# Patient Record
Sex: Female | Born: 1975
Health system: Southern US, Community
[De-identification: ages and names within clinical notes are randomized; demographics above are authoritative.]

## PROBLEM LIST (undated history)

## (undated) ENCOUNTER — Inpatient Hospital Stay (HOSPITAL_COMMUNITY): Payer: Self-pay

## (undated) DIAGNOSIS — R918 Other nonspecific abnormal finding of lung field: Secondary | ICD-10-CM

## (undated) DIAGNOSIS — K5900 Constipation, unspecified: Secondary | ICD-10-CM

## (undated) DIAGNOSIS — IMO0001 Reserved for inherently not codable concepts without codable children: Secondary | ICD-10-CM

## (undated) HISTORY — DX: Other nonspecific abnormal finding of lung field: R91.8

---

## 2014-06-30 HISTORY — PX: OVARIAN CYST REMOVAL: SHX89

## 2016-02-07 ENCOUNTER — Ambulatory Visit (HOSPITAL_COMMUNITY)
Admission: EM | Admit: 2016-02-07 | Discharge: 2016-02-07 | Disposition: A | Discharge status: Nursing Home (Includes ICF) | Payer: BLUE CROSS/BLUE SHIELD | Source: Home / Self Care | Attending: Emergency Medicine | Admitting: Emergency Medicine

## 2016-02-07 ENCOUNTER — Emergency Department (HOSPITAL_COMMUNITY): Payer: BLUE CROSS/BLUE SHIELD

## 2016-02-07 ENCOUNTER — Emergency Department (HOSPITAL_COMMUNITY)
Admission: EM | Admit: 2016-02-07 | Discharge: 2016-02-08 | Disposition: A | Discharge status: Home / Self Care | Payer: BLUE CROSS/BLUE SHIELD | Attending: Emergency Medicine | Admitting: Emergency Medicine

## 2016-02-07 ENCOUNTER — Encounter (HOSPITAL_COMMUNITY): Payer: Self-pay

## 2016-02-07 ENCOUNTER — Ambulatory Visit (INDEPENDENT_AMBULATORY_CARE_PROVIDER_SITE_OTHER): Payer: BLUE CROSS/BLUE SHIELD

## 2016-02-07 DIAGNOSIS — R1012 Left upper quadrant pain: Secondary | ICD-10-CM | POA: Insufficient documentation

## 2016-02-07 DIAGNOSIS — R0781 Pleurodynia: Secondary | ICD-10-CM

## 2016-02-07 DIAGNOSIS — R222 Localized swelling, mass and lump, trunk: Secondary | ICD-10-CM | POA: Insufficient documentation

## 2016-02-07 DIAGNOSIS — R918 Other nonspecific abnormal finding of lung field: Secondary | ICD-10-CM

## 2016-02-07 DIAGNOSIS — R070 Pain in throat: Secondary | ICD-10-CM

## 2016-02-07 DIAGNOSIS — Z85118 Personal history of other malignant neoplasm of bronchus and lung: Secondary | ICD-10-CM | POA: Diagnosis not present

## 2016-02-07 DIAGNOSIS — M549 Dorsalgia, unspecified: Secondary | ICD-10-CM | POA: Insufficient documentation

## 2016-02-07 DIAGNOSIS — Z3202 Encounter for pregnancy test, result negative: Secondary | ICD-10-CM | POA: Diagnosis not present

## 2016-02-07 LAB — URINALYSIS, ROUTINE W REFLEX MICROSCOPIC
Bilirubin Urine: NEGATIVE
GLUCOSE, UA: NEGATIVE mg/dL
Ketones, ur: NEGATIVE mg/dL
Leukocytes, UA: NEGATIVE
NITRITE: NEGATIVE
PROTEIN: NEGATIVE mg/dL
Specific Gravity, Urine: 1.011 (ref 1.005–1.030)
pH: 8.5 — ABNORMAL HIGH (ref 5.0–8.0)

## 2016-02-07 LAB — CBC
HCT: 38.5 % (ref 36.0–46.0)
Hemoglobin: 12.9 g/dL (ref 12.0–15.0)
MCH: 32.1 pg (ref 26.0–34.0)
MCHC: 33.5 g/dL (ref 30.0–36.0)
MCV: 95.8 fL (ref 78.0–100.0)
PLATELETS: 231 10*3/uL (ref 150–400)
RBC: 4.02 MIL/uL (ref 3.87–5.11)
RDW: 13.5 % (ref 11.5–15.5)
WBC: 3.5 10*3/uL — AB (ref 4.0–10.5)

## 2016-02-07 LAB — POCT I-STAT, CHEM 8
BUN: <3 mg/dL — ABNORMAL LOW (ref 6–20)
CREATININE: 0.6 mg/dL (ref 0.44–1.00)
Calcium, Ion: 1.01 mmol/L — ABNORMAL LOW (ref 1.12–1.23)
Chloride: 100 mmol/L — ABNORMAL LOW (ref 101–111)
GLUCOSE: 97 mg/dL (ref 65–99)
HCT: 46 % (ref 36.0–46.0)
HEMOGLOBIN: 15.6 g/dL — AB (ref 12.0–15.0)
Potassium: 5.6 mmol/L — ABNORMAL HIGH (ref 3.5–5.1)
Sodium: 138 mmol/L (ref 135–145)
TCO2: 32 mmol/L (ref 0–100)

## 2016-02-07 LAB — POCT URINALYSIS DIP (DEVICE)
Bilirubin Urine: NEGATIVE
Glucose, UA: NEGATIVE mg/dL
KETONES UR: NEGATIVE mg/dL
Leukocytes, UA: NEGATIVE
NITRITE: NEGATIVE
PH: 8.5 — AB (ref 5.0–8.0)
PROTEIN: NEGATIVE mg/dL
Specific Gravity, Urine: 1.01 (ref 1.005–1.030)
Urobilinogen, UA: 0.2 mg/dL (ref 0.0–1.0)

## 2016-02-07 LAB — POC URINE PREG, ED: PREG TEST UR: NEGATIVE

## 2016-02-07 LAB — COMPREHENSIVE METABOLIC PANEL
ALT: 22 U/L (ref 14–54)
AST: 27 U/L (ref 15–41)
Albumin: 4.1 g/dL (ref 3.5–5.0)
Alkaline Phosphatase: 65 U/L (ref 38–126)
Anion gap: 8 (ref 5–15)
BUN: <5 mg/dL — ABNORMAL LOW (ref 6–20)
CHLORIDE: 100 mmol/L — AB (ref 101–111)
CO2: 31 mmol/L (ref 22–32)
CREATININE: 0.66 mg/dL (ref 0.44–1.00)
Calcium: 10 mg/dL (ref 8.9–10.3)
Glucose, Bld: 103 mg/dL — ABNORMAL HIGH (ref 65–99)
Potassium: 4 mmol/L (ref 3.5–5.1)
Sodium: 139 mmol/L (ref 135–145)
Total Bilirubin: 0.4 mg/dL (ref 0.3–1.2)
Total Protein: 7.3 g/dL (ref 6.5–8.1)

## 2016-02-07 LAB — URINE MICROSCOPIC-ADD ON

## 2016-02-07 LAB — LIPASE, BLOOD: LIPASE: 24 U/L (ref 11–51)

## 2016-02-07 MED ORDER — SODIUM CHLORIDE 0.9 % IV BOLUS (SEPSIS)
1000.0000 mL | Freq: Once | INTRAVENOUS | Status: AC
  Administered 2016-02-07: 1000 mL via INTRAVENOUS

## 2016-02-07 MED ORDER — IOPAMIDOL (ISOVUE-300) INJECTION 61%
INTRAVENOUS | Status: AC
  Administered 2016-02-07: 100 mL
  Filled 2016-02-07: qty 100

## 2016-02-07 NOTE — ED Notes (Signed)
Pt.; having rt. Lower abdominal pain for 2 months.  She was diagnosed with lung cancer in [place] and also had an ovarian cyst removed.  She denies any chest pain , n/v /d She denies any vaginal discharge or bleeding. She reports having sob with exertion.  She denies any urinary symptoms, She speaks [place] and her Brother is interrupting for her.

## 2016-02-07 NOTE — ED Provider Notes (Signed)
CSN: [id number]     Arrival date & time 02/07/16  1420 History   First MD Initiated Contact with Patient 02/07/16 1509     Chief Complaint  Patient presents with  . Flank Pain   (Consider location/radiation/quality/duration/timing/severity/associated sxs/prior Treatment) HPI  She is a 40 year old woman here for evaluation of left side pain. Pain has been present for several months. It has not changed. It is present when she stands up and moves. When she lays flat, it goes away.  No nausea, vomiting, diarrhea, constipation. No dysuria or urinary symptoms. She arrived here from [place] 5 months ago. Her brother is with her and acts as interpreter.  She states about 2 years ago she had a somewhat similar pain and was found to have an ovarian cyst that was removed surgically. When she received her visa to come to the [place] in June 2016 she underwent a medical exam and was told she has lung cancer. She has not had any treatment. She has never smoked or used chewing tobacco. She does report weight loss. No shortness of breath.  She has also started a factory job recently.  He also reports an intermittent history of left-sided throat pain. This resolves with a home remedy involving baking soda.  Past Medical History  Diagnosis Date  . Cancer ([place]) 03/2015    Lung   Past Surgical History  Procedure Laterality Date  . Ovarian cyst removal  06/30/2014   No family history on file. Social History  Substance Use Topics  . Smoking status: Never Smoker   . Smokeless tobacco: Never Used  . Alcohol Use: No   OB History    No data available     Review of Systems As in history of present illness Allergies  Review of patient's allergies indicates no known allergies.  Home Medications   Prior to Admission medications   Not on File   Meds Ordered and Administered this Visit  Medications - No data to display  BP 105/66 mmHg  Pulse 74  Temp(Src) 98.5 F (36.9 C) (Oral)  Resp 12  SpO2  100%  LMP 02/05/2016 (Exact Date) No data found.   Physical Exam  Constitutional: She is oriented to person, place, and time. She appears well-developed and well-nourished. No distress.  Thin woman  HENT:  Mouth/Throat: Oropharynx is clear and moist. No oropharyngeal exudate.  Neck: Neck supple.  Cardiovascular: Normal rate, regular rhythm and normal heart sounds.   No murmur heard. Pulmonary/Chest: Effort normal and breath sounds normal. No respiratory distress. She has no wheezes. She has no rales. She exhibits no tenderness.  Abdominal: Soft. Bowel sounds are normal. She exhibits no distension. There is tenderness (very minimal discomfort in left upper quadrant). There is no rebound and no guarding.  Lymphadenopathy:    She has no cervical adenopathy.  Neurological: She is alert and oriented to person, place, and time.    ED Course  Procedures (including critical care time)  Labs Review Labs Reviewed  POCT I-STAT, CHEM 8 - Abnormal; Notable for the following:    Potassium 5.6 (*)    Chloride 100 (*)    BUN <3 (*)    Calcium, Ion 1.01 (*)    Hemoglobin 15.6 (*)    All other components within normal limits  POCT URINALYSIS DIP (DEVICE) - Abnormal; Notable for the following:    Hgb urine dipstick MODERATE (*)    pH 8.5 (*)    All other components within normal limits  Imaging Review Dg Chest 2 View  02/07/2016  CLINICAL DATA:  40 year old female with a history of pain. EXAM: CHEST - 2 VIEW COMPARISON:  None. FINDINGS: Cardiomediastinal silhouette projects within normal limits in size and contour. No confluent airspace disease, pneumothorax, or pleural effusion. Rounded nodule in the right hilar region, with well-defined margin on all sides both anterior lateral view, favored to be within lung No displaced fracture. Unremarkable appearance of the upper abdomen. IMPRESSION: No radiographic evidence of acute cardiopulmonary disease. Right hilar lung nodule. Further evaluation  with contrast-enhanced CT is recommended. These results will be called to the ordering clinician or representative by the Radiologist Assistant, and communication documented in the PACS or zVision Dashboard. Signed, Dulcy Fanny. Earleen Newport, DO [facility] Radiology Electronically Signed   By: Corrie Mckusick D.O.   On: 02/07/2016 16:07     MDM   1. Lung mass   2. Rib pain on left side   3. Throat pain    X-ray shows a perihilar mass. Further evaluation is recommended with CT scan. Given that she does not have a PCP, will transfer to [facility] ED for additional evaluation today. Her rib pain could be musculoskeletal.  With the lung mass I'm concerned that this could be related to potential cancer.  uA with blood, but patient is on her menstrual cycle.    Melony Overly, MD 02/07/16 [phone]

## 2016-02-07 NOTE — ED Notes (Signed)
Patient states she is having pain in her side x2 months, she has been diagnosed with ovarian cyst and she had it removed surgically on 06/30/2014 in [place]. She has currently been diagnosed with lung cancer on June of 2016. Patient has not taken any pain medication, she has no primary physician. No acute distress

## 2016-02-07 NOTE — ED Provider Notes (Addendum)
CSN: [id number]     Arrival date & time 02/07/16  1647 History   First MD Initiated Contact with Patient 02/07/16 2047     Chief Complaint  Patient presents with  . Abdominal Pain     (Consider location/radiation/quality/duration/timing/severity/associated sxs/prior Treatment) HPI Patient presents with several months of left upper abdominal pain that was of gradual onset. Worse with standing and movement. She states at times it radiates to her back. It is improved with lying flat. Denies nausea, vomiting, diarrhea or constipation. No blood in stool. Denies urinary symptoms including dysuria, hematuria, frequency or urgency. She states that when she arrived in the [place] and had a medical exam she was told that she may have cancer. She has not been evaluated since. Denies any fever or chills. States the pain does not change with food. No new lower extremity swelling or pain. Admits to weight loss but is unsure how much. Past Medical History  Diagnosis Date  . Cancer ([place]) 03/2015    Lung   Past Surgical History  Procedure Laterality Date  . Ovarian cyst removal  06/30/2014   No family history on file. Social History  Substance Use Topics  . Smoking status: Never Smoker   . Smokeless tobacco: Never Used  . Alcohol Use: No   OB History    No data available     Review of Systems  Constitutional: Negative for fever and chills.  Respiratory: Negative for cough and shortness of breath.   Cardiovascular: Negative for chest pain, palpitations and leg swelling.  Gastrointestinal: Positive for abdominal pain. Negative for nausea, vomiting, diarrhea, constipation and blood in stool.  Genitourinary: Negative for dysuria, frequency, hematuria, vaginal bleeding, vaginal discharge and pelvic pain.  Musculoskeletal: Positive for back pain. Negative for myalgias, neck pain and neck stiffness.  Skin: Negative for rash and wound.  Neurological: Negative for dizziness, syncope, weakness,  light-headedness, numbness and headaches.  All other systems reviewed and are negative.     Allergies  Review of patient's allergies indicates no known allergies.  Home Medications   Prior to Admission medications   Medication Sig Start Date End Date Taking? Authorizing Provider  dicyclomine (BENTYL) 20 MG tablet Take 1 tablet (20 mg total) by mouth 2 (two) times daily as needed for spasms. 02/07/16   Julianne Rice, MD  docusate sodium (COLACE) 100 MG capsule Take 1 capsule (100 mg total) by mouth every 12 (twelve) hours. 02/07/16   Julianne Rice, MD  polyethylene glycol [facility] / Floria Raveling) packet Take 17 g by mouth daily. 02/07/16   Julianne Rice, MD   BP 123/81 mmHg  Pulse 95  Temp(Src) 98.1 F (36.7 C) (Oral)  Resp 17  SpO2 100%  LMP 02/05/2016 (Exact Date) Physical Exam  Constitutional: She is oriented to person, place, and time. She appears well-developed and well-nourished. No distress.  HENT:  Head: Normocephalic and atraumatic.  Mouth/Throat: Oropharynx is clear and moist. No oropharyngeal exudate.  Eyes: EOM are normal. Pupils are equal, round, and reactive to light.  Neck: Normal range of motion. Neck supple. No JVD present.  Cardiovascular: Normal rate and regular rhythm.  Exam reveals no gallop and no friction rub.   No murmur heard. Pulmonary/Chest: Effort normal and breath sounds normal. No respiratory distress. She has no wheezes. She has no rales. She exhibits no tenderness.  Abdominal: Soft. Bowel sounds are normal. She exhibits no distension and no mass. There is tenderness (left upper quadrant tenderness with palpation. Mild fullness on exam). There is no  rebound and no guarding.  Musculoskeletal: Normal range of motion. She exhibits no edema or tenderness.  No midline thoracic or lumbar tenderness. No bilateral CVA tenderness. No lower extremity swelling, asymmetry or tenderness. Distal pulses are equal and intact.  Neurological: She is alert and oriented  to person, place, and time.  Patient is alert and oriented x3 with clear, goal oriented speech. Patient has 5/5 motor in all extremities. Sensation is intact to light touch.   Skin: Skin is warm and dry. No rash noted. No erythema.  Psychiatric: She has a normal mood and affect. Her behavior is normal.  Nursing note and vitals reviewed.   ED Course  Procedures (including critical care time) Labs Review Labs Reviewed  COMPREHENSIVE METABOLIC PANEL - Abnormal; Notable for the following:    Chloride 100 (*)    Glucose, Bld 103 (*)    BUN <5 (*)    All other components within normal limits  CBC - Abnormal; Notable for the following:    WBC 3.5 (*)    All other components within normal limits  URINALYSIS, ROUTINE W REFLEX MICROSCOPIC (NOT AT [facility]) - Abnormal; Notable for the following:    pH 8.5 (*)    Hgb urine dipstick TRACE (*)    All other components within normal limits  URINE MICROSCOPIC-ADD ON - Abnormal; Notable for the following:    Squamous Epithelial / LPF 0-5 (*)    Bacteria, UA RARE (*)    All other components within normal limits  LIPASE, BLOOD  POC URINE PREG, ED    Imaging Review Dg Chest 2 View  02/07/2016  CLINICAL DATA:  Short of breath. Recent lung cancer diagnosis by patient report. EXAM: CHEST  2 VIEW COMPARISON:  Chest radiograph same day FINDINGS: Again demonstrated round 2.5 cm perihilar lesion on the RIGHT. Lungs are clear otherwise.Mild scoliosis. IMPRESSION: RIGHT perihilar rounded lesion. Recommend CT thorax with contrast for further evaluation. Electronically Signed   By: Suzy Bouchard M.D.   On: 02/07/2016 18:57   Dg Chest 2 View  02/07/2016  CLINICAL DATA:  40 year old female with a history of pain. EXAM: CHEST - 2 VIEW COMPARISON:  None. FINDINGS: Cardiomediastinal silhouette projects within normal limits in size and contour. No confluent airspace disease, pneumothorax, or pleural effusion. Rounded nodule in the right hilar region, with well-defined  margin on all sides both anterior lateral view, favored to be within lung No displaced fracture. Unremarkable appearance of the upper abdomen. IMPRESSION: No radiographic evidence of acute cardiopulmonary disease. Right hilar lung nodule. Further evaluation with contrast-enhanced CT is recommended. These results will be called to the ordering clinician or representative by the Radiologist Assistant, and communication documented in the PACS or zVision Dashboard. Signed, Dulcy Fanny. Earleen Newport, DO [facility] Radiology Electronically Signed   By: Corrie Mckusick D.O.   On: 02/07/2016 16:07   Ct Chest W Contrast  02/07/2016  CLINICAL DATA:  left sided lower chest, upper abdominal pain ongoing for few months. Reports that it get's worse when standing. H/O ovarian cyst removal EXAM: CT CHEST, ABDOMEN, AND PELVIS WITH CONTRAST TECHNIQUE: Multidetector CT imaging of the chest, abdomen and pelvis was performed following the standard protocol during bolus administration of intravenous contrast. CONTRAST:  100 cc ISOVUE-300 IOPAMIDOL (ISOVUE-300) INJECTION 61% COMPARISON:  None. FINDINGS: CT CHEST FINDINGS Mediastinum/Nodes: No axillary supraclavicular adenopathy. No mediastinal hilar adenopathy. No pericardial. Lungs/Pleura: Rounded mass posterior to the RIGHT hilum measures 2.9 x 2.6 cm. This lesion is equal  density to the adjacent vascular structures indicating intense enhancement versus a vascular lesion. Lesion is smoothly marginated. No additional pulmonary lesions. Musculoskeletal: No aggressive osseous lesion. CT ABDOMEN AND PELVIS FINDINGS Hepatobiliary: No focal hepatic lesion. No biliary ductal dilatation. Gallbladder is normal. Common bile duct is normal. Pancreas: Pancreas is normal. No ductal dilatation. No pancreatic inflammation. Spleen: Normal spleen Adrenals/urinary tract: Adrenal glands and kidneys are normal. The ureters and bladder normal. Stomach/Bowel: Stomach,  small bowel, appendix, and cecum are normal. The colon and rectosigmoid colon are normal. Vascular/Lymphatic: Abdominal aorta is normal caliber. There is no retroperitoneal or periportal lymphadenopathy. No pelvic lymphadenopathy. Reproductive: Uterus and ovaries are normal. Other: No free fluid. Musculoskeletal: No aggressive osseous lesion. IMPRESSION: Chest Impression: Uniformly enhancing round mass posterior adjacent to the RIGHT hilum with differential including enhancing mass versus a vascular anomaly. If mass lesion would favor a non aggressive neoplasm. MRI of the thorax without and with contrast may add specificity to differential. Abdomen / Pelvis Impression: No acute findings in the abdomen or pelvis. Electronically Signed   By: Suzy Bouchard M.D.   On: 02/07/2016 23:47   Ct Abdomen Pelvis W Contrast  02/07/2016  CLINICAL DATA:  left sided lower chest, upper abdominal pain ongoing for few months. Reports that it get's worse when standing. H/O ovarian cyst removal EXAM: CT CHEST, ABDOMEN, AND PELVIS WITH CONTRAST TECHNIQUE: Multidetector CT imaging of the chest, abdomen and pelvis was performed following the standard protocol during bolus administration of intravenous contrast. CONTRAST:  100 cc ISOVUE-300 IOPAMIDOL (ISOVUE-300) INJECTION 61% COMPARISON:  None. FINDINGS: CT CHEST FINDINGS Mediastinum/Nodes: No axillary supraclavicular adenopathy. No mediastinal hilar adenopathy. No pericardial. Lungs/Pleura: Rounded mass posterior to the RIGHT hilum measures 2.9 x 2.6 cm. This lesion is equal density to the adjacent vascular structures indicating intense enhancement versus a vascular lesion. Lesion is smoothly marginated. No additional pulmonary lesions. Musculoskeletal: No aggressive osseous lesion. CT ABDOMEN AND PELVIS FINDINGS Hepatobiliary: No focal hepatic lesion. No biliary ductal dilatation. Gallbladder is normal. Common bile duct is normal. Pancreas: Pancreas is normal. No ductal dilatation.  No pancreatic inflammation. Spleen: Normal spleen Adrenals/urinary tract: Adrenal glands and kidneys are normal. The ureters and bladder normal. Stomach/Bowel: Stomach, small bowel, appendix, and cecum are normal. The colon and rectosigmoid colon are normal. Vascular/Lymphatic: Abdominal aorta is normal caliber. There is no retroperitoneal or periportal lymphadenopathy. No pelvic lymphadenopathy. Reproductive: Uterus and ovaries are normal. Other: No free fluid. Musculoskeletal: No aggressive osseous lesion. IMPRESSION: Chest Impression: Uniformly enhancing round mass posterior adjacent to the RIGHT hilum with differential including enhancing mass versus a vascular anomaly. If mass lesion would favor a non aggressive neoplasm. MRI of the thorax without and with contrast may add specificity to differential. Abdomen / Pelvis Impression: No acute findings in the abdomen or pelvis. Electronically Signed   By: Suzy Bouchard M.D.   On: 02/07/2016 23:47   I have personally reviewed and evaluated these images and lab results as part of my medical decision-making.   EKG Interpretation   Date/Time:  Friday February 07 2016 21:17:18 EDT Ventricular Rate:  68 PR Interval:  163 QRS Duration: 65 QT Interval:  379 QTC Calculation: 403 R Axis:   80 Text Interpretation:  Sinus rhythm Nonspecific T abnrm, anterolateral  leads Confirmed by Lita Mains  MD, Isaiyah Feldhaus ([id number]) on 02/08/2016 12:16:36 AM      MDM   Final diagnoses:  Left upper quadrant pain  Chest mass   Explained results of testing and the need to follow-up  for further characterization of chest mass. Patient's CT abdomen read as no acute findings. Patient does have a moderate to large amount of stool in the colon and this would correspond with her episodic abdominal pain radiating to the flank. Will start on bowel regimen. She understands the need to return immediately for worsening symptoms, fever, persistent vomiting or for any concerns.  Repeat  abdominal exam is benign. No rebound or guarding. She is resting comfortably. Vital signs remained stable.   Julianne Rice, MD 02/08/16 [id number]  Julianne Rice, MD 02/08/16 [phone]

## 2016-02-08 MED ORDER — DOCUSATE SODIUM 100 MG PO CAPS: 100.0000 mg | ORAL_CAPSULE | Freq: Two times a day (BID) | ORAL | Status: DC

## 2016-02-08 MED ORDER — POLYETHYLENE GLYCOL 3350 17 G PO PACK
17.0000 g | PACK | Freq: Every day | ORAL | Status: DC
Start: 2016-02-07 — End: 2016-03-10

## 2016-02-08 MED ORDER — DICYCLOMINE HCL 20 MG PO TABS: 20.0000 mg | ORAL_TABLET | Freq: Two times a day (BID) | ORAL | Status: DC | PRN

## 2016-02-08 NOTE — Discharge Instructions (Signed)
You need to establish care with a primary physician and follow-up for further characterization of chest mass. Take medications as prescribed.   Abdominal Pain, Adult Many things can cause abdominal pain. Usually, abdominal pain is not caused by a disease and will improve without treatment. It can often be observed and treated at home. Your health care provider will do a physical exam and possibly order blood tests and X-rays to help determine the seriousness of your pain. However, in many cases, more time must pass before a clear cause of the pain can be found. Before that point, your health care provider may not know if you need more testing or further treatment. HOME CARE INSTRUCTIONS Monitor your abdominal pain for any changes. The following actions may help to alleviate any discomfort you are experiencing:  Only take over-the-counter or prescription medicines as directed by your health care provider.  Do not take laxatives unless directed to do so by your health care provider.  Try a clear liquid diet (broth, tea, or water) as directed by your health care provider. Slowly move to a bland diet as tolerated. SEEK MEDICAL CARE IF:  You have unexplained abdominal pain.  You have abdominal pain associated with nausea or diarrhea.  You have pain when you urinate or have a bowel movement.  You experience abdominal pain that wakes you in the night.  You have abdominal pain that is worsened or improved by eating food.  You have abdominal pain that is worsened with eating fatty foods.  You have a fever. SEEK IMMEDIATE MEDICAL CARE IF:  Your pain does not go away within 2 hours.  You keep throwing up (vomiting).  Your pain is felt only in portions of the abdomen, such as the right side or the left lower portion of the abdomen.  You pass bloody or black tarry stools. MAKE SURE YOU:  Understand these instructions.  Will watch your condition.  Will get help right away if you are not  doing well or get worse.   This information is not intended to replace advice given to you by your health care provider. Make sure you discuss any questions you have with your health care provider.   Document Released: 07/15/2005 Document Revised: 06/26/2015 Document Reviewed: 06/14/2013 Elsevier Interactive Patient Education 2016 [employer].   Constipation, Adult Constipation is when a person has fewer than three bowel movements a week, has difficulty having a bowel movement, or has stools that are dry, hard, or larger than normal. As people grow older, constipation is more common. A low-fiber diet, not taking in enough fluids, and taking certain medicines may make constipation worse.  CAUSES   Certain medicines, such as antidepressants, pain medicine, iron supplements, antacids, and water pills.   Certain diseases, such as diabetes, irritable bowel syndrome (IBS), thyroid disease, or depression.   Not drinking enough water.   Not eating enough fiber-rich foods.   Stress or travel.   Lack of physical activity or exercise.   Ignoring the urge to have a bowel movement.   Using laxatives too much.  SIGNS AND SYMPTOMS   Having fewer than three bowel movements a week.   Straining to have a bowel movement.   Having stools that are hard, dry, or larger than normal.   Feeling full or bloated.   Pain in the lower abdomen.   Not feeling relief after having a bowel movement.  DIAGNOSIS  Your health care provider will take a medical history and perform a physical exam.  Further testing may be done for severe constipation. Some tests may include:  A barium enema X-ray to examine your rectum, colon, and, sometimes, your small intestine.   A sigmoidoscopy to examine your lower colon.   A colonoscopy to examine your entire colon. TREATMENT  Treatment will depend on the severity of your constipation and what is causing it. Some dietary treatments include drinking  more fluids and eating more fiber-rich foods. Lifestyle treatments may include regular exercise. If these diet and lifestyle recommendations do not help, your health care provider may recommend taking over-the-counter laxative medicines to help you have bowel movements. Prescription medicines may be prescribed if over-the-counter medicines do not work.  HOME CARE INSTRUCTIONS   Eat foods that have a lot of fiber, such as fruits, vegetables, whole grains, and beans.  Limit foods high in fat and processed sugars, such as french fries, hamburgers, cookies, candies, and soda.   A fiber supplement may be added to your diet if you cannot get enough fiber from foods.   Drink enough fluids to keep your urine clear or pale yellow.   Exercise regularly or as directed by your health care provider.   Go to the restroom when you have the urge to go. Do not hold it.   Only take over-the-counter or prescription medicines as directed by your health care provider. Do not take other medicines for constipation without talking to your health care provider first.  [place] IF:   You have bright red blood in your stool.   Your constipation lasts for more than 4 days or gets worse.   You have abdominal or rectal pain.   You have thin, pencil-like stools.   You have unexplained weight loss. MAKE SURE YOU:   Understand these instructions.  Will watch your condition.  Will get help right away if you are not doing well or get worse.   This information is not intended to replace advice given to you by your health care provider. Make sure you discuss any questions you have with your health care provider.   Document Released: 07/03/2004 Document Revised: 10/26/2014 Document Reviewed: 07/17/2013 Elsevier Interactive Patient Education [employer].

## 2016-02-18 ENCOUNTER — Ambulatory Visit: Payer: BLUE CROSS/BLUE SHIELD | Attending: Internal Medicine | Admitting: Internal Medicine

## 2016-02-18 ENCOUNTER — Encounter: Payer: Self-pay | Admitting: Internal Medicine

## 2016-02-18 VITALS — BP 109/73 | HR 87 | Temp 98.0°F | Wt 109.6 lb

## 2016-02-18 DIAGNOSIS — K5900 Constipation, unspecified: Secondary | ICD-10-CM | POA: Insufficient documentation

## 2016-02-18 DIAGNOSIS — Z79899 Other long term (current) drug therapy: Secondary | ICD-10-CM | POA: Insufficient documentation

## 2016-02-18 DIAGNOSIS — K219 Gastro-esophageal reflux disease without esophagitis: Secondary | ICD-10-CM | POA: Diagnosis not present

## 2016-02-18 DIAGNOSIS — R918 Other nonspecific abnormal finding of lung field: Secondary | ICD-10-CM | POA: Diagnosis present

## 2016-02-18 DIAGNOSIS — R221 Localized swelling, mass and lump, neck: Secondary | ICD-10-CM | POA: Diagnosis not present

## 2016-02-18 DIAGNOSIS — R634 Abnormal weight loss: Secondary | ICD-10-CM | POA: Diagnosis not present

## 2016-02-18 MED ORDER — PANTOPRAZOLE SODIUM 20 MG PO TBEC: 20.0000 mg | DELAYED_RELEASE_TABLET | Freq: Every day | ORAL | Status: DC

## 2016-02-18 NOTE — Progress Notes (Signed)
Cathy Little, is a 40 y.o. female  CWC:[id number]  VOH:[id number]  DOB - March 31, 1976  CC: No chief complaint on file. er f/u     HPI: Cathy Little is a 40 y.o. female here today to establish medical care.  Pt is new to our clinic.  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  Pt found to be constipated so prescribed bowel regimen. She was also found to have 2.9cm x 2.6cm right hilum mass on CT, felt either enhancing mass vs a vascular anomaly.  MRI recd for further visualizaiton.   Of note, since dx with "cancer" pt has been watching her food intake, limiting salt/sugars.  Per brother who is here w/ her, she became a vegetarian by choice and has lost quite a bit of weight (they could not quantify how much).   Abd pains have improved since using bowel regimen rx and bowel movements more regular.   She complains of raw feeling in her throat, "burnng sensation" all the time, not related w/ food.   Patient has No headache, No chest pain, No abdominal pain - No Nausea, No new weakness tingling or numbness, No Cough - SOB.  [place] interpreter via [place] used throughout exam today.  No Known Allergies Past Medical History  Diagnosis Date  . Cancer ([place]) 03/2015    Lung   Current Outpatient Prescriptions on File Prior to Visit  Medication Sig Dispense Refill  . dicyclomine (BENTYL) 20 MG tablet Take 1 tablet (20 mg total) by mouth 2 (two) times daily as needed for spasms. 20 tablet 0  . docusate sodium (COLACE) 100 MG capsule Take 1 capsule (100 mg total) by mouth every 12 (twelve) hours. 60 capsule 0  . polyethylene glycol (MIRALAX / GLYCOLAX) packet Take 17 g by mouth daily. 14 each 0   No current facility-administered medications on file prior to visit.   No family history on file. Social History   Social History  . Marital Status: Single    Spouse Name: N/A  . Number of Children: N/A  . Years of Education: N/A    Occupational History  . Not on file.   Social History Main Topics  . Smoking status: Never Smoker   . Smokeless tobacco: Never Used  . Alcohol Use: No  . Drug Use: No  . Sexual Activity: Not on file   Other Topics Concern  . Not on file   Social History Narrative    Review of Systems: Constitutional: Negative for fever, chills, diaphoresis, activity change, appetite change and fatigue. 'weight loss" HENT: Negative for ear pain, nosebleeds, congestion, facial swelling, rhinorrhea, neck pain, neck stiffness and ear discharge.  Eyes: Negative for pain, discharge, redness, itching and visual disturbance. Respiratory: Negative for cough, choking, chest tightness, shortness of breath, wheezing and stridor.  Cardiovascular: Negative for chest pain, palpitations and leg swelling. Gastrointestinal: Negative for abdominal distention. Genitourinary: Negative for dysuria, urgency, frequency, hematuria, flank pain, decreased urine volume, difficulty urinating and dyspareunia.  Musculoskeletal: Negative for back pain, joint swelling, arthralgia and gait problem. Neurological: Negative for dizziness, tremors, seizures, syncope, facial asymmetry, speech difficulty, weakness, light-headedness, numbness and headaches.  Hematological: Negative for adenopathy. Does not bruise/bleed easily. Psychiatric/Behavioral: Negative for hallucinations, behavioral problems, confusion, dysphoric mood, decreased concentration and agitation.    Objective:  There were no vitals filed for this visit.  Physical Exam: Constitutional: Patient appears well-developed and well-nourished. No distress. AAOx3,  very thin appearing female. pleasant HENT: Normocephalic, atraumatic, External right and left ear normal. Oropharynx is clear and moist.  bilat TMs clear.  Erthyma/cobble stoning noted in orapharyx. Eyes: Conjunctivae and EOM are normal. PERRL, no scleral icterus. Neck: Normal ROM. Neck supple. No JVD. No tracheal  deviation. Neck fullness, but could be due to body habitus (very thin) CVS: RRR, S1/S2 +, no murmurs, no gallops, no carotid bruit.  Pulmonary: Effort and breath sounds normal, no stridor, rhonchi, wheezes, rales.  Abdominal: Soft. BS +, no distension, tenderness, rebound or guarding.  Musculoskeletal: Normal range of motion. No edema and no tenderness.  LE: bilat/ no c/c/e, pulses 2+ bilateral. Lymphadenopathy: No lymphadenopathy noted, cervical Neuro: Alert.  muscle tone coordination. No cranial nerve deficit grossly. Skin: Skin is warm and dry. No rash noted. Not diaphoretic. No erythema. No pallor. Psychiatric: Normal mood and affect. Behavior, judgment, thought content normal.  Lab Results  Component Value Date   WBC 3.5* 02/07/2016   HGB 12.9 02/07/2016   HCT 38.5 02/07/2016   MCV 95.8 02/07/2016   PLT 231 02/07/2016   Lab Results  Component Value Date   CREATININE 0.66 02/07/2016   BUN <5* 02/07/2016   NA 139 02/07/2016   K 4.0 02/07/2016   CL 100* 02/07/2016   CO2 31 02/07/2016    No results found for: HGBA1C Lipid Panel  No results found for: CHOL, TRIG, HDL, CHOLHDL, VLDL, LDLCALC     No flowsheet data found.   EXAM: CHEST 2 VIEW  COMPARISON: Chest radiograph same day  FINDINGS: Again demonstrated round 2.5 cm perihilar lesion on the RIGHT.  Lungs are clear otherwise.Mild scoliosis.  IMPRESSION: RIGHT perihilar rounded lesion. Recommend CT thorax with contrast for further evaluation.  --   Ct chest  /abdomen 02/10/16  (scans personally reviewed by me as well) IMPRESSION: Chest Impression:  Uniformly enhancing round mass posterior adjacent to the RIGHT hilum with differential including enhancing mass versus a vascular anomaly. If mass lesion would favor a non aggressive neoplasm. MRI of the thorax without and with contrast may add specificity to differential.  Abdomen / Pelvis Impression:  No acute findings in the abdomen or  pelvis.  Assessment and plan:   1. Mass of lung, right hilum - hx of dx "lung cancer and neck cancer" in [place], no treatment. - concerning for either non aggressive neoplasm vs vascular anomoly - MRI thorax and neck w/w/o contrast ordered for further evaluation, depending on results, may need Heme/onc cs vs CTS., biopsies, etc.  -chk tsh/free t4/free t4/tpa  2. Constipation, unspecified constipation type - noted on CTs - recd high fiber diet, bowel regimen, prn stool softerners/laxatives   3. vegitarian by choice - very thin, suspect bmi <18 - encourage expanding her diet, more proteins  4. Weight loss - may be due to nutritional deficits vs "cancer" - fu on MRIs, thyroid labs (went straight to MRI neck since pt getting mri lungs at same time)  5. Jerrye Bushy? Trial ppi 20 qday  F/u 1 month for lung mass.   The patient was given clear instructions to go to ER or return to medical center if symptoms don't improve, worsen or new problems develop. The patient verbalized understanding. The patient was told to call to get lab results if they haven't heard anything in the next week.      [name], MD, [place], [place]   02/18/2016, 2:49 PM

## 2016-02-18 NOTE — Patient Instructions (Addendum)
High-Fiber Diet  Fiber, also called dietary fiber, is a type of carbohydrate found in fruits, vegetables, whole grains, and beans. A high-fiber diet can have many health benefits. Your health care provider may recommend a high-fiber diet to help:  · Prevent constipation. Fiber can make your bowel movements more regular.  · Lower your cholesterol.  · Relieve hemorrhoids, uncomplicated diverticulosis, or irritable bowel syndrome.  · Prevent overeating as part of a weight-loss plan.  · Prevent heart disease, type 2 diabetes, and certain cancers.  WHAT IS MY PLAN?  The recommended daily intake of fiber includes:  · 38 grams for men under age 50.  · 30 grams for men over age 50.  · 25 grams for women under age 50.  · 21 grams for women over age 50.  You can get the recommended daily intake of dietary fiber by eating a variety of fruits, vegetables, grains, and beans. Your health care provider may also recommend a fiber supplement if it is not possible to get enough fiber through your diet.  WHAT DO I NEED TO KNOW ABOUT A HIGH-FIBER DIET?  · Fiber supplements have not been widely studied for their effectiveness, so it is better to get fiber through food sources.  · Always check the fiber content on the nutrition facts label of any prepackaged food. Look for foods that contain at least 5 grams of fiber per serving.  · Ask your dietitian if you have questions about specific foods that are related to your condition, especially if those foods are not listed in the following section.  · Increase your daily fiber consumption gradually. Increasing your intake of dietary fiber too quickly may cause bloating, cramping, or gas.  · Drink plenty of water. Water helps you to digest fiber.  WHAT FOODS CAN I EAT?  Grains  Whole-grain breads. Multigrain cereal. Oats and oatmeal. Brown rice. Barley. Bulgur wheat. Millet. Bran muffins. Popcorn. Rye wafer crackers.  Vegetables  Sweet potatoes. Spinach. Kale. Artichokes. Cabbage. Broccoli.  Green peas. Carrots. Squash.  Fruits  Berries. Pears. Apples. Oranges. Avocados. Prunes and raisins. Dried figs.  Meats and Other Protein Sources  [place]y, kidney, pinto, and soy beans. Split peas. Lentils. Nuts and seeds.  Dairy  Fiber-fortified yogurt.  Beverages  Fiber-fortified soy milk. Fiber-fortified orange juice.  Other  Fiber bars.  The items listed above may not be a complete list of recommended foods or beverages. Contact your dietitian for more options.  WHAT FOODS ARE NOT RECOMMENDED?  Grains  White bread. Pasta made with refined flour. White rice.  Vegetables  Fried potatoes. Canned vegetables. Well-cooked vegetables.   Fruits  Fruit juice. Cooked, strained fruit.  Meats and Other Protein Sources  Fatty cuts of meat. Fried poultry or fried fish.  Dairy  Milk. Yogurt. Cream cheese. Sour cream.  Beverages  Soft drinks.  Other  Cakes and pastries. Butter and oils.  The items listed above may not be a complete list of foods and beverages to avoid. Contact your dietitian for more information.  WHAT ARE SOME TIPS FOR INCLUDING HIGH-FIBER FOODS IN MY DIET?  · Eat a wide variety of high-fiber foods.  · Make sure that half of all grains consumed each day are whole grains.  · Replace breads and cereals made from refined flour or white flour with whole-grain breads and cereals.  · Replace white rice with brown rice, bulgur wheat, or millet.  · Start the day with a breakfast that is high in fiber, such as a   cereal that contains at least 5 grams of fiber per serving.  · Use beans in place of meat in soups, salads, or pasta.  · Eat high-fiber snacks, such as berries, raw vegetables, nuts, or popcorn.     This information is not intended to replace advice given to you by your health care provider. Make sure you discuss any questions you have with your health care provider.     Document Released: 10/05/2005 Document Revised: 10/26/2014 Document Reviewed: 03/20/2014  Elsevier Interactive Patient Education ©2016 Elsevier  Inc.

## 2016-02-19 ENCOUNTER — Ambulatory Visit: Payer: BLUE CROSS/BLUE SHIELD | Attending: Internal Medicine

## 2016-02-19 DIAGNOSIS — Z029 Encounter for administrative examinations, unspecified: Secondary | ICD-10-CM | POA: Diagnosis present

## 2016-02-19 LAB — TSH: TSH: 3.39 mIU/L

## 2016-02-19 LAB — T3, FREE: T3, Free: 3.7 pg/mL (ref 2.3–4.2)

## 2016-02-19 LAB — T4, FREE: Free T4: 1.4 ng/dL (ref 0.8–1.8)

## 2016-02-19 NOTE — Progress Notes (Signed)
Pt is here for lab work only.

## 2016-02-20 LAB — THYROID PEROXIDASE ANTIBODY: Thyroperoxidase Ab SerPl-aCnc: 5 IU/mL (ref <9)

## 2016-02-25 ENCOUNTER — Telehealth: Payer: Self-pay | Admitting: Internal Medicine

## 2016-02-25 ENCOUNTER — Telehealth: Payer: Self-pay

## 2016-02-25 DIAGNOSIS — Z8589 Personal history of malignant neoplasm of other organs and systems: Secondary | ICD-10-CM

## 2016-02-25 NOTE — Telephone Encounter (Signed)
Clld pt - [place] regarding of MRI neck soft tissue only w/wo contrast. This has been scheduled for 03/06/16 at 4 pm at [facility]. Pt needs to arrive at 3:45 pm. No prep is required.

## 2016-02-25 NOTE — Telephone Encounter (Signed)
Received message from [facility] about pt's MRI studies.  Called [phone]. Per [facility] specialist, pt's MRI of neck and chest has been approved, will f/u w/ studies.

## 2016-02-26 ENCOUNTER — Ambulatory Visit (HOSPITAL_COMMUNITY)
Admission: RE | Admit: 2016-02-26 | Discharge: 2016-02-26 | Disposition: A | Discharge status: Home / Self Care | Payer: BLUE CROSS/BLUE SHIELD | Source: Ambulatory Visit | Attending: Internal Medicine | Admitting: Internal Medicine

## 2016-02-26 ENCOUNTER — Ambulatory Visit (HOSPITAL_COMMUNITY): Admission: RE | Admit: 2016-02-26 | Payer: BLUE CROSS/BLUE SHIELD | Source: Ambulatory Visit

## 2016-02-26 DIAGNOSIS — R918 Other nonspecific abnormal finding of lung field: Secondary | ICD-10-CM

## 2016-02-26 DIAGNOSIS — Z8589 Personal history of malignant neoplasm of other organs and systems: Secondary | ICD-10-CM | POA: Insufficient documentation

## 2016-02-26 MED ORDER — GADOBENATE DIMEGLUMINE 529 MG/ML IV SOLN
10.0000 mL | Freq: Once | INTRAVENOUS | Status: AC | PRN
  Administered 2016-02-26: 10 mL via INTRAVENOUS

## 2016-02-28 ENCOUNTER — Other Ambulatory Visit: Payer: Self-pay | Admitting: Internal Medicine

## 2016-02-28 DIAGNOSIS — R918 Other nonspecific abnormal finding of lung field: Secondary | ICD-10-CM

## 2016-03-05 ENCOUNTER — Other Ambulatory Visit: Payer: Self-pay | Admitting: *Deleted

## 2016-03-05 ENCOUNTER — Institutional Professional Consult (permissible substitution) (INDEPENDENT_AMBULATORY_CARE_PROVIDER_SITE_OTHER): Payer: BLUE CROSS/BLUE SHIELD | Admitting: Thoracic Surgery (Cardiothoracic Vascular Surgery)

## 2016-03-05 ENCOUNTER — Encounter: Payer: Self-pay | Admitting: Thoracic Surgery (Cardiothoracic Vascular Surgery)

## 2016-03-05 VITALS — BP 99/64 | HR 94 | Resp 16 | Ht 66.0 in | Wt 113.0 lb

## 2016-03-05 DIAGNOSIS — R918 Other nonspecific abnormal finding of lung field: Secondary | ICD-10-CM

## 2016-03-05 DIAGNOSIS — D381 Neoplasm of uncertain behavior of trachea, bronchus and lung: Secondary | ICD-10-CM

## 2016-03-05 DIAGNOSIS — J984 Other disorders of lung: Secondary | ICD-10-CM

## 2016-03-05 HISTORY — DX: Other nonspecific abnormal finding of lung field: R91.8

## 2016-03-05 NOTE — Progress Notes (Signed)
PCP is [name], MD Referring Provider is [name], MD  Chief Complaint  Patient presents with  . Lung Lesion    RLLobe...CT CHEST/ABD 02/07/16, MRI NECK/CHEST 02/26/16    HPI: 40 year old woman who recently moved to [place] from [place] who presented with a chief complaint of chest pain.  Ms. Brickel is a 40 year old woman with a history of ovarian cysts and is a lifelong nonsmoker. She recently moved here from [place]. She speaks primarily [place] and is accompanied by a professional an interpreter, as well as her brother who speaks [place].  She was in her usual state of health until recently when she began having pain in her chest. She says she experiences chest pain when she is sad or upset and sometimes after eating. It is not exertional. She also complains of some pain along the bottom of her left rib cage. And also sometimes has pain in her right side of her back. She went to the emergency room after having an episode of the chest pain. A chest x-ray showed a right lower lobe mass. A CT was done which showed a 3.2 cm right lower lobe mass that was marginated. There is a question of whether this might represent vascular malformations to an MR was done. 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  She denies cough, wheezing, hemoptysis, shortness of breath, weight loss, change in appetite. She works full time in a [employer].   Past Medical History  Diagnosis Date  . Cancer ([place]) 03/2015    Lung    Past Surgical History  Procedure Laterality Date  . Ovarian cyst removal  06/30/2014    No family history on file.  Social History Social History  Substance Use Topics  . Smoking status: Never Smoker   . Smokeless tobacco: Never Used  . Alcohol Use: No    Current Outpatient Prescriptions  Medication Sig Dispense Refill  . dicyclomine (BENTYL) 20 MG tablet Take 1 tablet (20 mg total) by mouth 2 (two) times daily as  needed for spasms. 20 tablet 0  . docusate sodium (COLACE) 100 MG capsule Take 1 capsule (100 mg total) by mouth every 12 (twelve) hours. 60 capsule 0  . pantoprazole (PROTONIX) 20 MG tablet Take 1 tablet (20 mg total) by mouth daily. 90 tablet 3  . polyethylene glycol (MIRALAX / GLYCOLAX) packet Take 17 g by mouth daily. 14 each 0   No current facility-administered medications for this visit.    No Known Allergies  Review of Systems  Constitutional: Negative for fever, chills, activity change, appetite change, fatigue and unexpected weight change.  HENT: Negative for trouble swallowing and voice change.   Eyes: Negative for visual disturbance.  Respiratory: Negative for cough, shortness of breath and wheezing.   Cardiovascular: Positive for chest pain (pleuritic left sided).  Gastrointestinal: Positive for abdominal pain (occassional). Negative for nausea, vomiting and abdominal distention.  Genitourinary: Negative for dysuria, vaginal bleeding and difficulty urinating.  Musculoskeletal: Positive for neck pain (right sided). Negative for back pain.  Neurological: Negative for seizures, syncope and weakness.  Hematological: Negative for adenopathy. Does not bruise/bleed easily.    BP 99/64 mmHg  Pulse 94  Resp 16  Ht '5\' 6"'$  (1.676 m)  Wt 113 lb (51.256 kg)  BMI 18.25 kg/m2  SpO2 99%  LMP 02/05/2016 (Exact Date) Physical Exam  Constitutional: She is oriented to person, place, and time. She appears well-developed and well-nourished. No distress.  HENT:  Head: Normocephalic and atraumatic.  Eyes: Conjunctivae and EOM are normal. Pupils are equal, round, and reactive to light. No scleral icterus.  Neck: Normal range of motion. Neck supple. No thyromegaly present.  Cardiovascular: Normal rate, regular rhythm, normal heart sounds and intact distal pulses.  Exam reveals no gallop and no friction rub.   No murmur heard. Pulmonary/Chest: Effort normal and breath sounds normal. No  respiratory distress. She has no wheezes. She has no rales.  Abdominal: Soft. She exhibits no distension. There is no tenderness.  Musculoskeletal: She exhibits no edema.  Lymphadenopathy:    She has no cervical adenopathy.  Neurological: She is alert and oriented to person, place, and time. No cranial nerve deficit.  Skin: Skin is warm and dry.  Vitals reviewed.    Diagnostic Tests: CT CHEST, ABDOMEN, AND PELVIS WITH CONTRAST  TECHNIQUE: Multidetector CT imaging of the chest, abdomen and pelvis was performed following the standard protocol during bolus administration of intravenous contrast.  CONTRAST: 100 cc ISOVUE-300 IOPAMIDOL (ISOVUE-300) INJECTION 61%  COMPARISON: None.  FINDINGS: CT CHEST FINDINGS  Mediastinum/Nodes: No axillary supraclavicular adenopathy. No mediastinal hilar adenopathy. No pericardial.  Lungs/Pleura: Rounded mass posterior to the RIGHT hilum measures 2.9 x 2.6 cm. This lesion is equal density to the adjacent vascular structures indicating intense enhancement versus a vascular lesion. Lesion is smoothly marginated.  No additional pulmonary lesions.  Musculoskeletal: No aggressive osseous lesion.  CT ABDOMEN AND PELVIS FINDINGS  Hepatobiliary: No focal hepatic lesion. No biliary ductal dilatation. Gallbladder is normal. Common bile duct is normal.  Pancreas: Pancreas is normal. No ductal dilatation. No pancreatic inflammation.  Spleen: Normal spleen  Adrenals/urinary tract: Adrenal glands and kidneys are normal. The ureters and bladder normal.  Stomach/Bowel: Stomach, small bowel, appendix, and cecum are normal. The colon and rectosigmoid colon are normal.  Vascular/Lymphatic: Abdominal aorta is normal caliber. There is no retroperitoneal or periportal lymphadenopathy. No pelvic lymphadenopathy.  Reproductive: Uterus and ovaries are normal.  Other: No free fluid.  Musculoskeletal: No aggressive osseous  lesion.  IMPRESSION: Chest Impression:  Uniformly enhancing round mass posterior adjacent to the RIGHT hilum with differential including enhancing mass versus a vascular anomaly. If mass lesion would favor a non aggressive neoplasm. MRI of the thorax without and with contrast may add specificity to differential.  Abdomen / Pelvis Impression:  No acute findings in the abdomen or pelvis.  MRI CHEST WITHOUT AND WITH CONTRAST  TECHNIQUE: Multiplanar multi sequence pre and postcontrast imaging of the thorax was obtained per routine protocol before and after the administration of IV gadolinium.  CONTRAST: 10 mL of MultiHance  COMPARISON: Chest CT 02/07/2016.  FINDINGS: In the perihilar aspect of the right lower lobe there is a well-circumscribed ovoid lesion which measures 2.5 x 3.2 x 2.3 cm. This lesion demonstrates high signal intensity on both precontrast T1 and T2 weighted images, and demonstrates avid enhancement on post gadolinium imaging. Notably, this lesion does not demonstrate flow voids on black blood imaging. This lesion appears intimately associated with the right lower lobe bronchi. Although intimately associated with the adjacent vessels, the vessels appear to deviate around the lesion, and do not appear to be significantly enlarged or tortuous as would be expected in the setting of an arteriovenous malformation. No definite lymphadenopathy noted in the mediastinum. Remaining portions of the visualized thorax are otherwise unremarkable.  IMPRESSION: 1. Avidly enhancing lesion in the central aspect of the right lower lobe which appears intimately associated with the right lower lobe bronchi.  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 This does not appear to be a vascular malformation, but this lesion does avidly enhance. Confirmation of carcinoid tumor could be obtained with nuclear medicine MIBG  scan, or tissue sampling could be performed via bronchoscopy.   Electronically Signed  By: Vinnie Langton M.D.  On: 02/26/2016 16:51  I personally reviewed the CT and MR of the chest and concur with the findings as noted above  Electronically Signed  By: Suzy Bouchard M.D.  On: 02/07/2016 23:47   Impression: 40 year old woman with a 3.2 cm well-circumscribed mass in the central portion of the right lower lobe. 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  My recommendation to her was that we proceed with bronchoscopy, right VATS, right lower lobectomy, possible right lower and middle bilobectomy.  I described the proposed operation to her and her brother in detail. They understand the general nature of the procedure, the incisions be used, use of drainage tubes postoperatively, the expected hospital stay, and the overall recovery. I reviewed the indications, risks, benefits, and alternatives. She understands the risks include, but are not limited to death, MI, DVT, PE, bleeding, possible need for transfusion, infection, prolonged air leak, cardiac arrhythmias, as well as the possibility of other unforeseeable complications. They do understand there is a possibility of tumor recurrence. They do understand that she may have physical limitations related to the amount of lung removed.  Plan:  Bronchoscopy, right VATS, right lower lobectomy, possible right lower and middle bilobectomy on 03/18/2016.  She will have pulmonary function tests preoperatively, although I do not expect there to be an issue.  Melrose Nakayama, MD [facility] [phone]

## 2016-03-06 ENCOUNTER — Ambulatory Visit (HOSPITAL_COMMUNITY): Payer: BLUE CROSS/BLUE SHIELD

## 2016-03-09 ENCOUNTER — Other Ambulatory Visit: Payer: Self-pay | Admitting: *Deleted

## 2016-03-09 DIAGNOSIS — R918 Other nonspecific abnormal finding of lung field: Secondary | ICD-10-CM

## 2016-03-11 ENCOUNTER — Telehealth: Payer: Self-pay | Admitting: Internal Medicine

## 2016-03-11 NOTE — Telephone Encounter (Signed)
Patient would like to speak with nurse

## 2016-03-12 NOTE — Telephone Encounter (Signed)
Clld pt - [place] with question(s).

## 2016-03-13 ENCOUNTER — Other Ambulatory Visit: Payer: Self-pay | Admitting: *Deleted

## 2016-03-13 ENCOUNTER — Encounter: Payer: Self-pay | Admitting: Thoracic Surgery (Cardiothoracic Vascular Surgery)

## 2016-03-13 ENCOUNTER — Ambulatory Visit (INDEPENDENT_AMBULATORY_CARE_PROVIDER_SITE_OTHER): Payer: BLUE CROSS/BLUE SHIELD | Admitting: Thoracic Surgery (Cardiothoracic Vascular Surgery)

## 2016-03-13 ENCOUNTER — Ambulatory Visit (HOSPITAL_COMMUNITY)
Admission: RE | Admit: 2016-03-13 | Discharge: 2016-03-13 | Disposition: A | Discharge status: Home / Self Care | Payer: BLUE CROSS/BLUE SHIELD | Source: Ambulatory Visit | Attending: Thoracic Surgery (Cardiothoracic Vascular Surgery) | Admitting: Thoracic Surgery (Cardiothoracic Vascular Surgery)

## 2016-03-13 VITALS — BP 110/70 | HR 67 | Resp 20 | Ht 66.0 in | Wt 113.0 lb

## 2016-03-13 DIAGNOSIS — R918 Other nonspecific abnormal finding of lung field: Secondary | ICD-10-CM | POA: Insufficient documentation

## 2016-03-13 DIAGNOSIS — D381 Neoplasm of uncertain behavior of trachea, bronchus and lung: Secondary | ICD-10-CM | POA: Diagnosis not present

## 2016-03-13 LAB — PULMONARY FUNCTION TEST
DL/VA % PRED: 112 %
DL/VA: 5.52 ml/min/mmHg/L
DLCO UNC % PRED: 91 %
DLCO UNC: 23.55 ml/min/mmHg
FEF 25-75 POST: 1.79 L/s
FEF 25-75 PRE: 1.81 L/s
FEF2575-%CHANGE-POST: -1 %
FEF2575-%Pred-Post: 61 %
FEF2575-%Pred-Pre: 62 %
FEV1-%CHANGE-POST: -1 %
FEV1-%Pred-Post: 78 %
FEV1-%Pred-Pre: 79 %
FEV1-POST: 2.07 L
FEV1-Pre: 2.1 L
FEV1FVC-%CHANGE-POST: -1 %
FEV1FVC-%PRED-PRE: 92 %
FEV6-%Change-Post: 1 %
FEV6-%Pred-Post: 86 %
FEV6-%Pred-Pre: 85 %
FEV6-PRE: 2.67 L
FEV6-Post: 2.72 L
FEV6FVC-%CHANGE-POST: 0 %
FEV6FVC-%PRED-PRE: 101 %
FEV6FVC-%Pred-Post: 101 %
FVC-%CHANGE-POST: 0 %
FVC-%Pred-Post: 85 %
FVC-%Pred-Pre: 84 %
FVC-Post: 2.72 L
FVC-Pre: 2.72 L
PRE FEV6/FVC RATIO: 100 %
Post FEV1/FVC ratio: 76 %
Post FEV6/FVC ratio: 100 %
Pre FEV1/FVC ratio: 77 %
RV % PRED: 158 %
RV: 2.58 L
TLC % pred: 97 %
TLC: 5.07 L

## 2016-03-13 MED ORDER — ALBUTEROL SULFATE (2.5 MG/3ML) 0.083% IN NEBU
2.5000 mg | INHALATION_SOLUTION | Freq: Once | RESPIRATORY_TRACT | Status: AC
  Administered 2016-03-13: 2.5 mg via RESPIRATORY_TRACT

## 2016-03-13 NOTE — Progress Notes (Signed)
[address]       [place] [address]             [phone]       HPI: Cathy Little presents today to review the results of her PFTs and further discuss management of her right lower lobe mass.  Ms. Hoes is a 40 year old woman who recently moved to [place] from [place]. She has a history of ovarian cysts and is a lifelong nonsmoker. She presented with a chief complaint of chest pain. She says she experiences chest pain when she is sad or upset and sometimes after eating. It is not exertional. She also complains of some pain along the bottom of her left rib cage. And also sometimes has pain in her right side of her back.   She went to the emergency room after having an episode of the chest pain. A chest x-ray showed a 3 cm right lower lobe mass. A CT confirmed a 3.2 cm right lower lobe mass that was smoothly marginated. There was a question whether this might represent a vascular malformation, so an MR was done. 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  I saw her in the office last week and discussed the workup and treatment with the patient and her brother along with a professional interpreter. We planned to do a bronchoscopy, right VATS, and right lower or bilobectomy. She was to get PFTs and return to answer any questions she might have. The indications, risks and benefits were discussed in detail with her last week.    She denies cough, wheezing, hemoptysis, shortness of breath, weight loss, change in appetite. She works full time in a [employer]. 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 She would notice that when she talked for a long time she would be short of breath and her voice would get weaker.  Past Medical History Ovarian cysts Lung mass  Past Surgical History  Procedure Laterality Date  . Ovarian cyst removal  06/30/2014    MEDICATIONS No current outpatient prescriptions on file.   No current  facility-administered medications for this visit.   NKDA  Physical Exam BP 110/70 mmHg  Pulse 67  Resp 20  Ht '5\' 6"'$  (1.676 m)  Wt 113 lb (51.256 kg)  BMI 18.25 kg/m2  SpO2 99% 40 yo woman in NAD Well developed and well nourished Alert and oriented x 3, no focal deficits No cervical or suprclavicular adenopathy Cardiac RRR, normal S1 and S2, no murmur Lungs clear, no wheezing Abdomen soft, nontender EXT no clubbing, cyanosis or edema  Diagnostic Tests: PFTs FVC= 2.72(84%) FEV1= 2.10 (79%), no change with bronchodilators DLCO= 23.55 (91%)  Impression: 40 year old woman who recently came to [place] from [place]. She had been having some vague chest and right sided neck pain. As part of her workup a chest x-ray showed a right lower lobe mass. This was confirmed by CT chest. There was no hilar or mediastinal adenopathy. An MR was done to rule out a vascular malformation. 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  I recommended to her that we proceed with bronchoscopy, right video-assisted thoracoscopy and right lower or possible bilobectomy. I previously discussed this in detail with her last week using a professional interpreter. She returned today so that I can answer any questions she might have.  Her pulmonary function shows adequate reserve to tolerate the procedure.  She had several questions which I  answered to the best of my ability. She does want to proceed with the resection.  Plan:  Bronchoscopy, Right VATS, right lower lobectomy or possible bilobectomy  Melrose Nakayama, MD [facility] [phone]

## 2016-03-13 NOTE — Progress Notes (Signed)
Patient ID: Cathy Little, female   DOB: [birth year], 40 y.o.   MRN: [id number]      [address]       [place] [address]             [phone]      [name] is scheduled for surgery on May 31,2017. I anticipate she will be out of work 8 to 12 weeks  Remo Lipps C. Roxan Hockey, MD [facility] [phone]

## 2016-03-15 MED ORDER — DEXTROSE 5 % IV SOLN: 1.5000 g | INTRAVENOUS | Status: DC

## 2016-03-17 ENCOUNTER — Encounter: Payer: Self-pay | Admitting: Internal Medicine

## 2016-03-17 ENCOUNTER — Ambulatory Visit (HOSPITAL_BASED_OUTPATIENT_CLINIC_OR_DEPARTMENT_OTHER): Payer: BLUE CROSS/BLUE SHIELD | Admitting: Internal Medicine

## 2016-03-17 ENCOUNTER — Ambulatory Visit (HOSPITAL_COMMUNITY)
Admission: RE | Admit: 2016-03-17 | Discharge: 2016-03-17 | Disposition: A | Discharge status: Home / Self Care | Payer: BLUE CROSS/BLUE SHIELD | Source: Ambulatory Visit | Attending: Thoracic Surgery (Cardiothoracic Vascular Surgery) | Admitting: Thoracic Surgery (Cardiothoracic Vascular Surgery)

## 2016-03-17 ENCOUNTER — Encounter (HOSPITAL_COMMUNITY)
Admission: RE | Admit: 2016-03-17 | Discharge: 2016-03-17 | Disposition: A | Discharge status: Home / Self Care | Payer: BLUE CROSS/BLUE SHIELD | Source: Ambulatory Visit | Attending: Thoracic Surgery (Cardiothoracic Vascular Surgery) | Admitting: Thoracic Surgery (Cardiothoracic Vascular Surgery)

## 2016-03-17 ENCOUNTER — Other Ambulatory Visit: Payer: Self-pay

## 2016-03-17 ENCOUNTER — Encounter (HOSPITAL_COMMUNITY): Payer: Self-pay

## 2016-03-17 VITALS — BP 92/58 | HR 74 | Temp 99.2°F | Resp 16 | Ht 66.0 in | Wt 110.2 lb

## 2016-03-17 VITALS — BP 102/65 | HR 86 | Temp 98.3°F | Wt 110.6 lb

## 2016-03-17 DIAGNOSIS — R918 Other nonspecific abnormal finding of lung field: Secondary | ICD-10-CM

## 2016-03-17 DIAGNOSIS — Z01812 Encounter for preprocedural laboratory examination: Secondary | ICD-10-CM | POA: Insufficient documentation

## 2016-03-17 DIAGNOSIS — J984 Other disorders of lung: Secondary | ICD-10-CM

## 2016-03-17 DIAGNOSIS — N86 Erosion and ectropion of cervix uteri: Secondary | ICD-10-CM

## 2016-03-17 DIAGNOSIS — Z01810 Encounter for preprocedural cardiovascular examination: Secondary | ICD-10-CM | POA: Insufficient documentation

## 2016-03-17 DIAGNOSIS — Z01818 Encounter for other preprocedural examination: Secondary | ICD-10-CM | POA: Insufficient documentation

## 2016-03-17 DIAGNOSIS — N898 Other specified noninflammatory disorders of vagina: Secondary | ICD-10-CM | POA: Diagnosis not present

## 2016-03-17 HISTORY — DX: Constipation, unspecified: K59.00

## 2016-03-17 LAB — COMPREHENSIVE METABOLIC PANEL
ALBUMIN: 4 g/dL (ref 3.5–5.0)
ALK PHOS: 73 U/L (ref 38–126)
ALT: 25 U/L (ref 14–54)
AST: 21 U/L (ref 15–41)
Anion gap: 8 (ref 5–15)
BILIRUBIN TOTAL: 0.7 mg/dL (ref 0.3–1.2)
BUN: 7 mg/dL (ref 6–20)
CALCIUM: 9.5 mg/dL (ref 8.9–10.3)
CO2: 20 mmol/L — ABNORMAL LOW (ref 22–32)
CREATININE: 0.55 mg/dL (ref 0.44–1.00)
Chloride: 109 mmol/L (ref 101–111)
GFR calc Af Amer: >60 mL/min (ref >60)
GLUCOSE: 91 mg/dL (ref 65–99)
POTASSIUM: 3.9 mmol/L (ref 3.5–5.1)
Sodium: 137 mmol/L (ref 135–145)
TOTAL PROTEIN: 7.6 g/dL (ref 6.5–8.1)

## 2016-03-17 LAB — CBC
HEMATOCRIT: 38.1 % (ref 36.0–46.0)
HEMOGLOBIN: 12.7 g/dL (ref 12.0–15.0)
MCH: 31.6 pg (ref 26.0–34.0)
MCHC: 33.3 g/dL (ref 30.0–36.0)
MCV: 94.8 fL (ref 78.0–100.0)
Platelets: 209 10*3/uL (ref 150–400)
RBC: 4.02 MIL/uL (ref 3.87–5.11)
RDW: 13.6 % (ref 11.5–15.5)
WBC: 3 10*3/uL — AB (ref 4.0–10.5)

## 2016-03-17 LAB — BLOOD GAS, ARTERIAL
Acid-base deficit: 2.7 mmol/L — ABNORMAL HIGH (ref 0.0–2.0)
BICARBONATE: 21.1 meq/L (ref 20.0–24.0)
Drawn by: 206361
FIO2: 0.21
O2 SAT: 98.3 %
PH ART: 7.418 (ref 7.350–7.450)
PO2 ART: 110 mmHg — AB (ref 80.0–100.0)
Patient temperature: 98.6
TCO2: 22.1 mmol/L (ref 0–100)
pCO2 arterial: 33.3 mmHg — ABNORMAL LOW (ref 35.0–45.0)

## 2016-03-17 LAB — POCT URINALYSIS DIPSTICK
BILIRUBIN UA: NEGATIVE
Blood, UA: NEGATIVE
Glucose, UA: NEGATIVE
KETONES UA: NEGATIVE
LEUKOCYTES UA: NEGATIVE
Nitrite, UA: NEGATIVE
PH UA: 7
Protein, UA: NEGATIVE
Spec Grav, UA: 1.01
Urobilinogen, UA: 0.2

## 2016-03-17 LAB — SURGICAL PCR SCREEN
MRSA, PCR: NEGATIVE
Staphylococcus aureus: NEGATIVE

## 2016-03-17 LAB — URINALYSIS, ROUTINE W REFLEX MICROSCOPIC
BILIRUBIN URINE: NEGATIVE
Glucose, UA: NEGATIVE mg/dL
Hgb urine dipstick: NEGATIVE
Ketones, ur: NEGATIVE mg/dL
LEUKOCYTES UA: NEGATIVE
NITRITE: NEGATIVE
PH: 8 (ref 5.0–8.0)
Protein, ur: NEGATIVE mg/dL
SPECIFIC GRAVITY, URINE: 1.012 (ref 1.005–1.030)

## 2016-03-17 LAB — APTT: aPTT: 45 seconds — ABNORMAL HIGH (ref 24–37)

## 2016-03-17 LAB — PROTIME-INR
INR: 1.31 (ref 0.00–1.49)
PROTHROMBIN TIME: 16.4 s — AB (ref 11.6–15.2)

## 2016-03-17 LAB — ABO/RH: ABO/RH(D): O POS

## 2016-03-17 MED ORDER — DEXTROSE 5 % IV SOLN
1.5000 g | INTRAVENOUS | Status: AC
  Administered 2016-03-18 (×2): 1.5 g via INTRAVENOUS
  Filled 2016-03-17: qty 1.5

## 2016-03-17 NOTE — Pre-Procedure Instructions (Addendum)
    Deaja Rizo  03/17/2016     Your procedure is scheduled on Wednesday, May 31..  Report to [facility] Admitting at 10: 20 AM                Your surgery or procedure is scheduled for 12:20 PM   Call this number if you have problems the morning of surgery:[phone]   Remember:  Do not eat food or drink liquids after midnight.  Take these medicines the morning of surgery with A SIP OF WATER :  None   Do not wear jewelry, make-up or nail polish.  Do not wear lotions, powders, or perfumes.   Do not shave 48 hours prior to surgery.    Do not bring valuables to the hospital.  [facility] is not responsible for any belongings or valuables.  Contacts, dentures or bridgework may not be worn into surgery.  Leave your suitcase in the car.  After surgery it may be brought to your room.  For patients admitted to the hospital, discharge time will be determined by your treatment team.  Please read over the following fact sheets that you were given: [facility]- Preparing For Surgery and Patient Instructions for Mupirocin Application

## 2016-03-17 NOTE — Progress Notes (Signed)
Ms Cathy Little asked for her brother Wendi Maya. Adanudo to be used as her interpreter today and day of surgery.  Koku Courtney Paris will be with patient during hospitalization

## 2016-03-17 NOTE — Telephone Encounter (Signed)
Pt was seen by PCP today. All questions and concerns were answered.

## 2016-03-17 NOTE — Progress Notes (Signed)
Cathy Little, is a 40 y.o. female  RSW:[id number]  KXF:[id number]  DOB - 1976/03/02  Chief Complaint  Patient presents with  . Follow-up    4 wk  - lung mass        Subjective:   Cathy Little is a 40 y.o. female here today for a follow up visit.  Doing well, has a f/u appt w/ CTS today for pending Bronch/right VATS/RLL lobectomy tomorrow.  Pt c/o of white/beige vaginal discharge x 1 wk, last menses 01/31/16.  She has been eating more and feeling better.  Per pt, had normal Pap smear in [place] 2 years ago, not currently sexually active.  Per pt, had prior hx of cyst removal in left ovary years ago in [place].  Since than, gets occasional left flank pain, none now.   Patient has No headache, No chest pain, No abdominal pain - No Nausea, No new weakness tingling or numbness, No Cough - SOB.  Here w/ brother, who left room during pap.  Interpreter was used to communicate directly with patient for the entire encounter including providing detailed patient instructions.   No problems updated.  ALLERGIES: No Known Allergies  PAST MEDICAL HISTORY: Past Medical History  Diagnosis Date  . Mass of lower lobe of right lung 03/05/2016    3.2 cm mass RLL, involving bronchus intermedius    MEDICATIONS AT HOME: Prior to Admission medications   Not on File     Objective:   Filed Vitals:   03/17/16 0923  BP: 102/65  Pulse: 86  Temp: 98.3 F (36.8 C)  TempSrc: Oral  Weight: 110 lb 9.6 oz (50.168 kg)   CMA present to assist during Papsmear exam  Exam General appearance : Awake, alert, not in any distress. Speech Clear. Not toxic looking, thin appearing, pleasant. HEENT: Atraumatic and Normocephalic. Neck: supple, no JVD. No cervical lymphadenopathy.  Chest:Good air entry bilaterally, no added sounds. CVS: S1 S2 regular, no murmurs Abdomen: Bowel sounds active, Non tender to deep palpation.  No palpable masses.  Pelvic Exam: Cervix abnormal, ulcerations/yellow plaque w/  erythema/edema in the 3-7 O;clock quandrant which is very friable in nature, external genitalia normal, no adnexal masses or tenderness, no cervical motion tenderness, rectovaginal septum normal, uterus normal size, shape, and white/thick mucopurlent vaginal discharge noted, no foul odor.  Extremities: B/L Lower Ext shows no edema, both legs are warm to touch Neurology: Awake alert, and oriented X 3, CN II-XII grossly intact, Non focal Skin:No Rash  Data Review No results found for: HGBA1C  Depression screen [facility] 2/9 03/17/2016 02/18/2016  Decreased Interest 0 0  Down, Depressed, Hopeless 0 0  PHQ - 2 Score 0 0  Altered sleeping 0 0  Tired, decreased energy 0 1  Change in appetite 0 0  Feeling bad or failure about yourself  0 0  Trouble concentrating 0 0  Moving slowly or fidgety/restless 0 0  Suicidal thoughts 0 0  PHQ-9 Score 0 1  Difficult doing work/chores Not difficult at all Not difficult at all      Assessment & Plan   1. Mass of lower lobe of right lung For Bronch/right VATS/rll lobectomy tomorw, 5/31, with CTS Dr Roxan Hockey  2. Vaginal discharge - sp pap/wet prep today. - per pt, menses 01/31/16, hx of normal pap 2 years ago in [place]  3. Cervical ulcerations. - possible infection, needs to r/o cancer - ob/gyn c/s urgent placed. - f/u on pap smear/ancillary testing. - will attempt to scan in phone  picture taken during exam, which was reviewed w/ Pt as well.     Patient have been counseled extensively about nutrition and exercise  Return in about 3 months (around 06/17/2016).  The patient was given clear instructions to go to ER or return to medical center if symptoms don't improve, worsen or new problems develop. The patient verbalized understanding. The patient was told to call to get lab results if they haven't heard anything in the next week.    [name], MD, [facility] [place], [place]     03/17/2016, 9:39 AM

## 2016-03-18 ENCOUNTER — Inpatient Hospital Stay (HOSPITAL_COMMUNITY): Payer: BLUE CROSS/BLUE SHIELD | Admitting: Certified Registered Nurse Anesthetist

## 2016-03-18 ENCOUNTER — Encounter (HOSPITAL_COMMUNITY): Payer: Self-pay | Admitting: Certified Registered Nurse Anesthetist

## 2016-03-18 ENCOUNTER — Inpatient Hospital Stay (HOSPITAL_COMMUNITY)
Admission: RE | Admit: 2016-03-18 | Discharge: 2016-04-05 | DRG: 829 | Disposition: A | Discharge status: Home / Self Care | Payer: BLUE CROSS/BLUE SHIELD | Source: Ambulatory Visit | Attending: Thoracic Surgery (Cardiothoracic Vascular Surgery) | Admitting: Thoracic Surgery (Cardiothoracic Vascular Surgery)

## 2016-03-18 ENCOUNTER — Inpatient Hospital Stay (HOSPITAL_COMMUNITY): Payer: BLUE CROSS/BLUE SHIELD

## 2016-03-18 ENCOUNTER — Encounter (HOSPITAL_COMMUNITY)
Admission: RE | Disposition: A | Discharge status: Home / Self Care | Payer: Self-pay | Source: Ambulatory Visit | Attending: Thoracic Surgery (Cardiothoracic Vascular Surgery)

## 2016-03-18 DIAGNOSIS — J95811 Postprocedural pneumothorax: Secondary | ICD-10-CM | POA: Diagnosis not present

## 2016-03-18 DIAGNOSIS — Y848 Other medical procedures as the cause of abnormal reaction of the patient, or of later complication, without mention of misadventure at the time of the procedure: Secondary | ICD-10-CM | POA: Diagnosis not present

## 2016-03-18 DIAGNOSIS — D3A8 Other benign neuroendocrine tumors: Secondary | ICD-10-CM | POA: Diagnosis present

## 2016-03-18 DIAGNOSIS — Z902 Acquired absence of lung [part of]: Secondary | ICD-10-CM

## 2016-03-18 DIAGNOSIS — Z09 Encounter for follow-up examination after completed treatment for conditions other than malignant neoplasm: Secondary | ICD-10-CM

## 2016-03-18 DIAGNOSIS — C3431 Malignant neoplasm of lower lobe, right bronchus or lung: Secondary | ICD-10-CM | POA: Diagnosis not present

## 2016-03-18 DIAGNOSIS — Y839 Surgical procedure, unspecified as the cause of abnormal reaction of the patient, or of later complication, without mention of misadventure at the time of the procedure: Secondary | ICD-10-CM | POA: Diagnosis not present

## 2016-03-18 DIAGNOSIS — J95812 Postprocedural air leak: Secondary | ICD-10-CM | POA: Diagnosis not present

## 2016-03-18 DIAGNOSIS — R918 Other nonspecific abnormal finding of lung field: Secondary | ICD-10-CM

## 2016-03-18 DIAGNOSIS — J939 Pneumothorax, unspecified: Secondary | ICD-10-CM

## 2016-03-18 DIAGNOSIS — Z4682 Encounter for fitting and adjustment of non-vascular catheter: Secondary | ICD-10-CM

## 2016-03-18 DIAGNOSIS — R079 Chest pain, unspecified: Secondary | ICD-10-CM | POA: Diagnosis present

## 2016-03-18 HISTORY — PX: VIDEO BRONCHOSCOPY: SHX5072

## 2016-03-18 HISTORY — PX: VIDEO ASSISTED THORACOSCOPY (VATS)/ LOBECTOMY: SHX6169

## 2016-03-18 LAB — CYTOLOGY - PAP

## 2016-03-18 LAB — CERVICOVAGINAL ANCILLARY ONLY
Chlamydia: NEGATIVE
NEISSERIA GONORRHEA: NEGATIVE
WET PREP (BD AFFIRM): NEGATIVE

## 2016-03-18 LAB — BETA HCG QUANT (REF LAB)

## 2016-03-18 LAB — PREPARE RBC (CROSSMATCH)

## 2016-03-18 SURGERY — BRONCHOSCOPY, VIDEO-ASSISTED
Anesthesia: General | Site: Chest | Laterality: Right

## 2016-03-18 MED ORDER — SODIUM CHLORIDE 0.9% FLUSH: 9.0000 mL | INTRAVENOUS | Status: DC | PRN

## 2016-03-18 MED ORDER — BISACODYL 5 MG PO TBEC
10.0000 mg | DELAYED_RELEASE_TABLET | Freq: Every day | ORAL | Status: DC
  Administered 2016-03-18 – 2016-04-04 (×15): 10 mg via ORAL
  Filled 2016-03-18 (×16): qty 2

## 2016-03-18 MED ORDER — SUGAMMADEX SODIUM 200 MG/2ML IV SOLN
INTRAVENOUS | Status: DC | PRN
  Administered 2016-03-18: 200 mg via INTRAVENOUS

## 2016-03-18 MED ORDER — ONDANSETRON HCL 4 MG/2ML IJ SOLN
4.0000 mg | Freq: Four times a day (QID) | INTRAMUSCULAR | Status: DC | PRN
  Administered 2016-03-19 – 2016-03-27 (×5): 4 mg via INTRAVENOUS
  Filled 2016-03-18 (×5): qty 2

## 2016-03-18 MED ORDER — PHENYLEPHRINE HCL 10 MG/ML IJ SOLN
INTRAMUSCULAR | Status: DC | PRN
  Administered 2016-03-18: 80 ug via INTRAVENOUS
  Administered 2016-03-18: 40 ug via INTRAVENOUS
  Administered 2016-03-18: 80 ug via INTRAVENOUS

## 2016-03-18 MED ORDER — HEMOSTATIC AGENTS (NO CHARGE) OPTIME
TOPICAL | Status: DC | PRN
  Administered 2016-03-18: 1 via TOPICAL

## 2016-03-18 MED ORDER — PANTOPRAZOLE SODIUM 40 MG PO TBEC
40.0000 mg | DELAYED_RELEASE_TABLET | Freq: Every day | ORAL | Status: DC
  Administered 2016-03-18 – 2016-04-04 (×17): 40 mg via ORAL
  Filled 2016-03-18 (×18): qty 1

## 2016-03-18 MED ORDER — ACETAMINOPHEN 10 MG/ML IV SOLN
INTRAVENOUS | Status: AC
  Filled 2016-03-18: qty 100

## 2016-03-18 MED ORDER — FENTANYL CITRATE (PF) 250 MCG/5ML IJ SOLN
INTRAMUSCULAR | Status: AC
  Filled 2016-03-18: qty 5

## 2016-03-18 MED ORDER — ACETAMINOPHEN 160 MG/5ML PO SOLN
1000.0000 mg | Freq: Four times a day (QID) | ORAL | Status: AC
  Administered 2016-03-20 – 2016-03-23 (×14): 1000 mg via ORAL
  Filled 2016-03-18 (×14): qty 40.6

## 2016-03-18 MED ORDER — OXYCODONE HCL 5 MG PO TABS: 5.0000 mg | ORAL_TABLET | Freq: Once | ORAL | Status: DC | PRN

## 2016-03-18 MED ORDER — PHENYLEPHRINE HCL 10 MG/ML IJ SOLN
10.0000 mg | INTRAVENOUS | Status: DC | PRN
  Administered 2016-03-18: 20 ug/min via INTRAVENOUS

## 2016-03-18 MED ORDER — OXYCODONE HCL 5 MG/5ML PO SOLN: 5.0000 mg | Freq: Once | ORAL | Status: DC | PRN

## 2016-03-18 MED ORDER — DEXTROSE 5 % IV SOLN
INTRAVENOUS | Status: AC
  Filled 2016-03-18: qty 1.5

## 2016-03-18 MED ORDER — PROPOFOL 10 MG/ML IV BOLUS
INTRAVENOUS | Status: AC
  Filled 2016-03-18: qty 20

## 2016-03-18 MED ORDER — ROCURONIUM BROMIDE 100 MG/10ML IV SOLN
INTRAVENOUS | Status: DC | PRN
  Administered 2016-03-18 (×2): 50 mg via INTRAVENOUS
  Administered 2016-03-18: 20 mg via INTRAVENOUS

## 2016-03-18 MED ORDER — SENNOSIDES-DOCUSATE SODIUM 8.6-50 MG PO TABS
1.0000 | ORAL_TABLET | Freq: Every day | ORAL | Status: DC
  Administered 2016-03-19 – 2016-04-03 (×14): 1 via ORAL
  Filled 2016-03-18 (×15): qty 1

## 2016-03-18 MED ORDER — BUPIVACAINE HCL (PF) 0.5 % IJ SOLN
INTRAMUSCULAR | Status: AC
  Filled 2016-03-18: qty 10

## 2016-03-18 MED ORDER — LIDOCAINE HCL (CARDIAC) 20 MG/ML IV SOLN
INTRAVENOUS | Status: DC | PRN
  Administered 2016-03-18: 40 mg via INTRAVENOUS

## 2016-03-18 MED ORDER — DIPHENHYDRAMINE HCL 12.5 MG/5ML PO ELIX
12.5000 mg | ORAL_SOLUTION | Freq: Four times a day (QID) | ORAL | Status: DC | PRN
  Filled 2016-03-18: qty 5

## 2016-03-18 MED ORDER — INSULIN ASPART 100 UNIT/ML ~~LOC~~ SOLN
0.0000 [IU] | Freq: Four times a day (QID) | SUBCUTANEOUS | Status: DC
  Administered 2016-03-19 (×2): 2 [IU] via SUBCUTANEOUS

## 2016-03-18 MED ORDER — FENTANYL CITRATE (PF) 250 MCG/5ML IJ SOLN
INTRAMUSCULAR | Status: DC | PRN
  Administered 2016-03-18: 25 ug via INTRAVENOUS
  Administered 2016-03-18 (×3): 50 ug via INTRAVENOUS
  Administered 2016-03-18: 150 ug via INTRAVENOUS
  Administered 2016-03-18 (×2): 50 ug via INTRAVENOUS

## 2016-03-18 MED ORDER — FENTANYL 40 MCG/ML IV SOLN
INTRAVENOUS | Status: AC
  Filled 2016-03-18: qty 25

## 2016-03-18 MED ORDER — LACTATED RINGERS IV SOLN
INTRAVENOUS | Status: DC
  Administered 2016-03-18 (×3): via INTRAVENOUS

## 2016-03-18 MED ORDER — ACETAMINOPHEN 500 MG PO TABS
1000.0000 mg | ORAL_TABLET | Freq: Four times a day (QID) | ORAL | Status: AC
  Administered 2016-03-18 – 2016-03-20 (×5): 1000 mg via ORAL
  Filled 2016-03-18 (×5): qty 2

## 2016-03-18 MED ORDER — DEXTROSE 5 % IV SOLN
1.5000 g | Freq: Two times a day (BID) | INTRAVENOUS | Status: AC
  Administered 2016-03-19 (×2): 1.5 g via INTRAVENOUS
  Filled 2016-03-18 (×2): qty 1.5

## 2016-03-18 MED ORDER — PROPOFOL 10 MG/ML IV BOLUS
INTRAVENOUS | Status: DC | PRN
  Administered 2016-03-18: 20 mg via INTRAVENOUS
  Administered 2016-03-18: 100 mg via INTRAVENOUS
  Administered 2016-03-18: 50 mg via INTRAVENOUS

## 2016-03-18 MED ORDER — 0.9 % SODIUM CHLORIDE (POUR BTL) OPTIME
TOPICAL | Status: DC | PRN
  Administered 2016-03-18: 1000 mL

## 2016-03-18 MED ORDER — LIDOCAINE 2% (20 MG/ML) 5 ML SYRINGE
INTRAMUSCULAR | Status: AC
  Filled 2016-03-18: qty 5

## 2016-03-18 MED ORDER — LACTATED RINGERS IV SOLN
INTRAVENOUS | Status: DC | PRN
  Administered 2016-03-18 (×2): via INTRAVENOUS

## 2016-03-18 MED ORDER — ACETAMINOPHEN 325 MG PO TABS: 325.0000 mg | ORAL_TABLET | ORAL | Status: DC | PRN

## 2016-03-18 MED ORDER — DEXAMETHASONE SODIUM PHOSPHATE 4 MG/ML IJ SOLN
INTRAMUSCULAR | Status: DC | PRN
  Administered 2016-03-18: 10 mg via INTRAVENOUS

## 2016-03-18 MED ORDER — FENTANYL CITRATE (PF) 100 MCG/2ML IJ SOLN
INTRAMUSCULAR | Status: AC
  Administered 2016-03-18: 100 ug via INTRAVENOUS
  Filled 2016-03-18: qty 2

## 2016-03-18 MED ORDER — BUPIVACAINE 0.5 % ON-Q PUMP SINGLE CATH 400 ML
400.0000 mL | INJECTION | Status: DC
  Filled 2016-03-18: qty 400

## 2016-03-18 MED ORDER — BUPIVACAINE ON-Q PAIN PUMP (FOR ORDER SET NO CHG)
INJECTION | Status: DC
  Filled 2016-03-18: qty 1

## 2016-03-18 MED ORDER — ACETAMINOPHEN 160 MG/5ML PO SOLN
325.0000 mg | ORAL | Status: DC | PRN
  Filled 2016-03-18: qty 20.3

## 2016-03-18 MED ORDER — ACETAMINOPHEN 10 MG/ML IV SOLN
INTRAVENOUS | Status: DC | PRN
  Administered 2016-03-18: 1000 mg via INTRAVENOUS

## 2016-03-18 MED ORDER — POTASSIUM CHLORIDE 10 MEQ/50ML IV SOLN: 10.0000 meq | Freq: Every day | INTRAVENOUS | Status: DC | PRN

## 2016-03-18 MED ORDER — ONDANSETRON HCL 4 MG/2ML IJ SOLN: 4.0000 mg | Freq: Four times a day (QID) | INTRAMUSCULAR | Status: DC | PRN

## 2016-03-18 MED ORDER — NALOXONE HCL 0.4 MG/ML IJ SOLN
0.4000 mg | INTRAMUSCULAR | Status: DC | PRN
  Filled 2016-03-18: qty 1

## 2016-03-18 MED ORDER — BUPIVACAINE HCL (PF) 0.5 % IJ SOLN
INTRAMUSCULAR | Status: DC | PRN
  Administered 2016-03-18: 10 mL

## 2016-03-18 MED ORDER — MIDAZOLAM HCL 2 MG/2ML IJ SOLN
INTRAMUSCULAR | Status: AC
  Filled 2016-03-18: qty 2

## 2016-03-18 MED ORDER — KCL IN DEXTROSE-NACL 20-5-0.9 MEQ/L-%-% IV SOLN
INTRAVENOUS | Status: DC
  Administered 2016-03-18 – 2016-03-23 (×2): via INTRAVENOUS
  Filled 2016-03-18 (×9): qty 1000

## 2016-03-18 MED ORDER — BUPIVACAINE 0.5 % ON-Q PUMP SINGLE CATH 400 ML
INJECTION | Status: DC | PRN
  Administered 2016-03-18: 400 mL

## 2016-03-18 MED ORDER — DIPHENHYDRAMINE HCL 50 MG/ML IJ SOLN
12.5000 mg | Freq: Four times a day (QID) | INTRAMUSCULAR | Status: DC | PRN
  Filled 2016-03-18: qty 0.25

## 2016-03-18 MED ORDER — HYDROMORPHONE HCL 1 MG/ML IJ SOLN
INTRAMUSCULAR | Status: AC
  Filled 2016-03-18: qty 1

## 2016-03-18 MED ORDER — MIDAZOLAM HCL 2 MG/2ML IJ SOLN
INTRAMUSCULAR | Status: AC
  Administered 2016-03-18: 2 mg via INTRAVENOUS
  Filled 2016-03-18: qty 2

## 2016-03-18 MED ORDER — TRAMADOL HCL 50 MG PO TABS
50.0000 mg | ORAL_TABLET | Freq: Four times a day (QID) | ORAL | Status: DC | PRN
  Administered 2016-03-18: 50 mg via ORAL
  Administered 2016-03-24 – 2016-03-30 (×8): 100 mg via ORAL
  Filled 2016-03-18: qty 1
  Filled 2016-03-18 (×8): qty 2

## 2016-03-18 MED ORDER — OXYCODONE HCL 5 MG PO TABS
5.0000 mg | ORAL_TABLET | ORAL | Status: DC | PRN
  Administered 2016-03-19 – 2016-03-21 (×10): 5 mg via ORAL
  Administered 2016-03-22 – 2016-03-27 (×9): 10 mg via ORAL
  Administered 2016-03-27: 5 mg via ORAL
  Administered 2016-03-27 – 2016-03-31 (×4): 10 mg via ORAL
  Administered 2016-04-02 – 2016-04-03 (×2): 5 mg via ORAL
  Filled 2016-03-18: qty 2
  Filled 2016-03-18: qty 1
  Filled 2016-03-18: qty 2
  Filled 2016-03-18: qty 1
  Filled 2016-03-18: qty 2
  Filled 2016-03-18: qty 1
  Filled 2016-03-18: qty 2
  Filled 2016-03-18 (×6): qty 1
  Filled 2016-03-18 (×4): qty 2
  Filled 2016-03-18: qty 1
  Filled 2016-03-18: qty 2
  Filled 2016-03-18: qty 1
  Filled 2016-03-18 (×5): qty 2
  Filled 2016-03-18: qty 1
  Filled 2016-03-18: qty 2

## 2016-03-18 MED ORDER — HYDROMORPHONE HCL 1 MG/ML IJ SOLN
0.2500 mg | INTRAMUSCULAR | Status: DC | PRN
  Administered 2016-03-18 (×2): 0.5 mg via INTRAVENOUS

## 2016-03-18 MED ORDER — ONDANSETRON HCL 4 MG/2ML IJ SOLN
INTRAMUSCULAR | Status: DC | PRN
  Administered 2016-03-18: 4 mg via INTRAVENOUS

## 2016-03-18 MED ORDER — FENTANYL 40 MCG/ML IV SOLN
INTRAVENOUS | Status: DC
  Administered 2016-03-18: 15 ug via INTRAVENOUS
  Administered 2016-03-18 – 2016-03-19 (×2): 210 ug via INTRAVENOUS
  Administered 2016-03-19: 19:00:00 via INTRAVENOUS
  Administered 2016-03-19: 165 ug via INTRAVENOUS
  Administered 2016-03-19: 210 ug via INTRAVENOUS
  Administered 2016-03-19: 30 ug via INTRAVENOUS
  Administered 2016-03-20: 75 ug via INTRAVENOUS
  Administered 2016-03-20: 0 ug via INTRAVENOUS
  Administered 2016-03-20: 165 ug via INTRAVENOUS
  Administered 2016-03-20: 240 ug via INTRAVENOUS
  Administered 2016-03-20: 90 ug via INTRAVENOUS
  Administered 2016-03-21: 120 ug via INTRAVENOUS
  Administered 2016-03-21: 130 ug via INTRAVENOUS
  Administered 2016-03-21: 180 ug via INTRAVENOUS
  Administered 2016-03-21: 315 ug via INTRAVENOUS
  Administered 2016-03-21: 150 ug via INTRAVENOUS
  Administered 2016-03-21: 04:00:00 via INTRAVENOUS
  Administered 2016-03-22: 315 ug via INTRAVENOUS
  Administered 2016-03-22: 105 ug via INTRAVENOUS
  Administered 2016-03-22: 165 ug via INTRAVENOUS
  Administered 2016-03-22: 240 ug via INTRAVENOUS
  Administered 2016-03-22 (×2): 90 ug via INTRAVENOUS
  Administered 2016-03-23: 13:00:00 via INTRAVENOUS
  Administered 2016-03-23: 105 ug via INTRAVENOUS
  Administered 2016-03-23: 165 ug via INTRAVENOUS
  Administered 2016-03-23: 60 ug via INTRAVENOUS
  Administered 2016-03-23 – 2016-03-24 (×2): 210 ug via INTRAVENOUS
  Administered 2016-03-24: 22:00:00 via INTRAVENOUS
  Administered 2016-03-24: 105 ug via INTRAVENOUS
  Administered 2016-03-24: 0 ug via INTRAVENOUS
  Administered 2016-03-24: 270 ug via INTRAVENOUS
  Administered 2016-03-25: 22:00:00 via INTRAVENOUS
  Administered 2016-03-25 (×2): 75 ug via INTRAVENOUS
  Administered 2016-03-25: 300 ug via INTRAVENOUS
  Administered 2016-03-26: 375 ug via INTRAVENOUS
  Administered 2016-03-26: 330 ug via INTRAVENOUS
  Administered 2016-03-26: 165 ug via INTRAVENOUS
  Filled 2016-03-18 (×6): qty 25

## 2016-03-18 MED ORDER — ESMOLOL HCL 100 MG/10ML IV SOLN
INTRAVENOUS | Status: DC | PRN
  Administered 2016-03-18: 30 mg via INTRAVENOUS
  Administered 2016-03-18: 5 mg via INTRAVENOUS
  Administered 2016-03-18: 20 mg via INTRAVENOUS

## 2016-03-18 SURGICAL SUPPLY — 108 items
APPLIER CLIP 5 13 M/L LIGAMAX5 (MISCELLANEOUS) ×4
APPLIER CLIP ROT 10 11.4 M/L (STAPLE)
BLOCK BITE 60FR ADLT L/F BLUE (MISCELLANEOUS) ×4 IMPLANT
BRUSH CYTOL CELLEBRITY 1.5X140 (MISCELLANEOUS) IMPLANT
CANISTER SUCTION 2500CC (MISCELLANEOUS) ×4 IMPLANT
CATH KIT ON Q 5IN SLV (PAIN MANAGEMENT) IMPLANT
CATH KIT ON-Q SILVERSOAK 5IN (CATHETERS) ×4 IMPLANT
CATH KIT ON-Q SILVERSOAK 7.5IN (CATHETERS) IMPLANT
CATH THORACIC 28FR (CATHETERS) ×4 IMPLANT
CATH THORACIC 28FR RT ANG (CATHETERS) IMPLANT
CATH THORACIC 36FR (CATHETERS) IMPLANT
CATH THORACIC 36FR RT ANG (CATHETERS) IMPLANT
CLIP APPLIE 5 13 M/L LIGAMAX5 (MISCELLANEOUS) ×2 IMPLANT
CLIP APPLIE ROT 10 11.4 M/L (STAPLE) IMPLANT
CLIP TI MEDIUM 6 (CLIP) ×4 IMPLANT
CONN ST 1/4X3/8  BEN (MISCELLANEOUS)
CONN ST 1/4X3/8 BEN (MISCELLANEOUS) IMPLANT
CONN Y 3/8X3/8X3/8  BEN (MISCELLANEOUS)
CONN Y 3/8X3/8X3/8 BEN (MISCELLANEOUS) IMPLANT
CONT SPEC 4OZ CLIKSEAL STRL BL (MISCELLANEOUS) ×24 IMPLANT
COTTONBALL LRG STERILE PKG (GAUZE/BANDAGES/DRESSINGS) IMPLANT
COVER SURGICAL LIGHT HANDLE (MISCELLANEOUS) IMPLANT
COVER TABLE BACK 60X90 (DRAPES) ×4 IMPLANT
CUTTER ECHEON FLEX ENDO 45 340 (ENDOMECHANICALS) ×4 IMPLANT
DERMABOND ADVANCED (GAUZE/BANDAGES/DRESSINGS) ×2
DERMABOND ADVANCED .7 DNX12 (GAUZE/BANDAGES/DRESSINGS) ×2 IMPLANT
DRAIN CHANNEL 28F RND 3/8 FF (WOUND CARE) ×4 IMPLANT
DRAIN CHANNEL 32F RND 10.7 FF (WOUND CARE) IMPLANT
DRAPE LAPAROSCOPIC ABDOMINAL (DRAPES) ×4 IMPLANT
DRAPE WARM FLUID 44X44 (DRAPE) ×4 IMPLANT
ELECT BLADE 6.5 EXT (BLADE) ×4 IMPLANT
ELECT REM PT RETURN 9FT ADLT (ELECTROSURGICAL) ×4
ELECTRODE REM PT RTRN 9FT ADLT (ELECTROSURGICAL) ×2 IMPLANT
FILTER STRAW FLUID ASPIR (MISCELLANEOUS) IMPLANT
FORCEPS BIOP RJ4 1.8 (CUTTING FORCEPS) IMPLANT
FORCEPS RADIAL JAW LRG 4 PULM (INSTRUMENTS) ×2 IMPLANT
GAUZE SPONGE 4X4 12PLY STRL (GAUZE/BANDAGES/DRESSINGS) ×4 IMPLANT
GLOVE SURG SIGNA 7.5 PF LTX (GLOVE) ×8 IMPLANT
GOWN STRL REUS W/ TWL LRG LVL3 (GOWN DISPOSABLE) ×4 IMPLANT
GOWN STRL REUS W/ TWL XL LVL3 (GOWN DISPOSABLE) ×2 IMPLANT
GOWN STRL REUS W/TWL LRG LVL3 (GOWN DISPOSABLE) ×4
GOWN STRL REUS W/TWL XL LVL3 (GOWN DISPOSABLE) ×2
HEMOSTAT SURGICEL 2X14 (HEMOSTASIS) ×4 IMPLANT
KIT BASIN OR (CUSTOM PROCEDURE TRAY) ×4 IMPLANT
KIT CLEAN ENDO COMPLIANCE (KITS) ×4 IMPLANT
KIT ROOM TURNOVER OR (KITS) ×4 IMPLANT
KIT SUCTION CATH 14FR (SUCTIONS) IMPLANT
NEEDLE 22X1 1/2 (OR ONLY) (NEEDLE) IMPLANT
NEEDLE BIOPSY TRANSBRONCH 21G (NEEDLE) IMPLANT
NS IRRIG 1000ML POUR BTL (IV SOLUTION) ×12 IMPLANT
PACK CHEST (CUSTOM PROCEDURE TRAY) ×4 IMPLANT
PAD ARMBOARD 7.5X6 YLW CONV (MISCELLANEOUS) ×8 IMPLANT
POUCH ENDO CATCH II 15MM (MISCELLANEOUS) ×4 IMPLANT
POUCH SPECIMEN RETRIEVAL 10MM (ENDOMECHANICALS) IMPLANT
PUMP ON Q 400MLX5ML/HR (PAIN MANAGEMENT) ×4 IMPLANT
RADIAL JAW LRG 4 PULMONARY (INSTRUMENTS) ×2
RELOAD GOLD ECHELON 45 (STAPLE) ×16 IMPLANT
RELOAD GREEN ECHELON 45 (STAPLE) ×8 IMPLANT
SCISSORS ENDO CVD 5DCS (MISCELLANEOUS) IMPLANT
SEALANT PROGEL (MISCELLANEOUS) ×4 IMPLANT
SEALANT SURG COSEAL 4ML (VASCULAR PRODUCTS) IMPLANT
SEALANT SURG COSEAL 8ML (VASCULAR PRODUCTS) IMPLANT
SHEARS HARMONIC HDI 20CM (ELECTROSURGICAL) ×4 IMPLANT
SOLUTION ANTI FOG 6CC (MISCELLANEOUS) ×4 IMPLANT
SPECIMEN JAR MEDIUM (MISCELLANEOUS) IMPLANT
SPONGE GAUZE 4X4 12PLY STER LF (GAUZE/BANDAGES/DRESSINGS) ×8 IMPLANT
SPONGE INTESTINAL PEANUT (DISPOSABLE) ×12 IMPLANT
SPONGE TONSIL 1 RF SGL (DISPOSABLE) ×4 IMPLANT
STAPLE RELOAD 2.5MM WHITE (STAPLE) ×20 IMPLANT
STAPLER VASCULAR ECHELON 35 (CUTTER) ×4 IMPLANT
SUT PROLENE 4 0 RB 1 (SUTURE) ×2
SUT PROLENE 4-0 RB1 .5 CRCL 36 (SUTURE) ×2 IMPLANT
SUT SILK  1 MH (SUTURE) ×6
SUT SILK 1 MH (SUTURE) ×6 IMPLANT
SUT SILK 1 TIES 10X30 (SUTURE) ×4 IMPLANT
SUT SILK 2 0 SH (SUTURE) IMPLANT
SUT SILK 2 0SH CR/8 30 (SUTURE) ×4 IMPLANT
SUT SILK 3 0 SH 30 (SUTURE) IMPLANT
SUT SILK 3 0SH CR/8 30 (SUTURE) IMPLANT
SUT VIC AB 1 CTX 36 (SUTURE) ×4
SUT VIC AB 1 CTX36XBRD ANBCTR (SUTURE) ×4 IMPLANT
SUT VIC AB 2-0 CT1 27 (SUTURE) ×2
SUT VIC AB 2-0 CT1 TAPERPNT 27 (SUTURE) ×2 IMPLANT
SUT VIC AB 2-0 CTX 36 (SUTURE) ×4 IMPLANT
SUT VIC AB 2-0 UR6 27 (SUTURE) IMPLANT
SUT VIC AB 3-0 MH 27 (SUTURE) IMPLANT
SUT VIC AB 3-0 X1 27 (SUTURE) ×4 IMPLANT
SUT VICRYL 2 TP 1 (SUTURE) IMPLANT
SWAB COLLECTION DEVICE MRSA (MISCELLANEOUS) IMPLANT
SYR 20ML ECCENTRIC (SYRINGE) ×4 IMPLANT
SYR 5ML LL (SYRINGE) ×4 IMPLANT
SYR 5ML LUER SLIP (SYRINGE) ×4 IMPLANT
SYR CONTROL 10ML LL (SYRINGE) IMPLANT
SYRINGE 10CC LL (SYRINGE) ×4 IMPLANT
SYSTEM SAHARA CHEST DRAIN ATS (WOUND CARE) ×4 IMPLANT
TAPE CLOTH 4X10 WHT NS (GAUZE/BANDAGES/DRESSINGS) ×4 IMPLANT
TAPE CLOTH SURG 4X10 WHT LF (GAUZE/BANDAGES/DRESSINGS) ×8 IMPLANT
TIP APPLICATOR SPRAY EXTEND 16 (VASCULAR PRODUCTS) ×4 IMPLANT
TOWEL OR 17X24 6PK STRL BLUE (TOWEL DISPOSABLE) ×4 IMPLANT
TOWEL OR 17X26 10 PK STRL BLUE (TOWEL DISPOSABLE) ×4 IMPLANT
TRAP SPECIMEN MUCOUS 40CC (MISCELLANEOUS) ×8 IMPLANT
TRAY FOLEY CATH 16FRSI W/METER (SET/KITS/TRAYS/PACK) ×4 IMPLANT
TROCAR XCEL BLADELESS 5X75MML (TROCAR) ×4 IMPLANT
TUBE ANAEROBIC SPECIMEN COL (MISCELLANEOUS) IMPLANT
TUBE CONNECTING 20'X1/4 (TUBING) ×1
TUBE CONNECTING 20X1/4 (TUBING) ×3 IMPLANT
TUNNELER SHEATH ON-Q 11GX8 DSP (PAIN MANAGEMENT) ×4 IMPLANT
WATER STERILE IRR 1000ML POUR (IV SOLUTION) ×4 IMPLANT

## 2016-03-18 NOTE — Anesthesia Procedure Notes (Addendum)
Procedure Name: Intubation Date/Time: 03/18/2016 1:28 PM Performed by: Ollen Bowl Pre-anesthesia Checklist: Patient identified, Emergency Drugs available, Suction available, Patient being monitored and Timeout performed Patient Re-evaluated:Patient Re-evaluated prior to inductionOxygen Delivery Method: Circle system utilized and Simple face mask Preoxygenation: Pre-oxygenation with 100% oxygen Intubation Type: IV induction Ventilation: Mask ventilation without difficulty Laryngoscope Size: Mac and 3 Grade View: Grade II Tube type: Oral Tube size: 8.0 mm Number of attempts: 2 Airway Equipment and Method: Patient positioned with wedge pillow and Stylet Placement Confirmation: ETT inserted through vocal cords under direct vision,  positive ETCO2 and breath sounds checked- equal and bilateral Secured at: 21 cm Tube secured with: Tape Dental Injury: Teeth and Oropharynx as per pre-operative assessment     Procedure Name: Intubation Date/Time: 03/18/2016 2:09 PM Performed by: Raphael Gibney T Pre-anesthesia Checklist: Patient identified, Emergency Drugs available, Suction available, Patient being monitored and Timeout performed Patient Re-evaluated:Patient Re-evaluated prior to inductionOxygen Delivery Method: Circle system utilized and Simple face mask Preoxygenation: Pre-oxygenation with 100% oxygen Laryngoscope Size: Mac and 3 Grade View: Grade II Endobronchial tube: Left, Double lumen EBT, EBT position confirmed by auscultation and EBT position confirmed by fiberoptic bronchoscope and 35 Fr Number of attempts: 1 Airway Equipment and Method: Patient positioned with wedge pillow and Stylet Placement Confirmation: ETT inserted through vocal cords under direct vision,  positive ETCO2 and breath sounds checked- equal and bilateral Secured at: 29 cm Tube secured with: Tape Dental Injury: Teeth and Oropharynx as per pre-operative assessment     Central Venous Catheter  Insertion Performed by: anesthesiologist Patient location: Pre-op. Preanesthetic checklist: patient identified, IV checked, site marked, risks and benefits discussed, surgical consent, monitors and equipment checked, pre-op evaluation, timeout performed and anesthesia consent Position: Trendelenburg Lidocaine 1% used for infiltration Landmarks identified and Seldinger technique used Catheter size: 8 Fr Central line was placed.Double lumen Procedure performed using ultrasound guided technique. Attempts: 1 Following insertion, dressing applied, line sutured and Biopatch. Post procedure assessment: blood return through all ports, free fluid flow and no air. Patient tolerated the procedure well with no immediate complications.

## 2016-03-18 NOTE — Transfer of Care (Signed)
Immediate Anesthesia Transfer of Care Note  Patient: Cathy Little  Procedure(s) Performed: Procedure(s): VIDEO BRONCHOSCOPY (N/A) VIDEO ASSISTED THORACOSCOPY (VATS)/RIGHT MIDDLE AND LOWER  BILOBECTOMY  (Right)  Patient Location: PACU  Anesthesia Type:General  Level of Consciousness: awake, sedated, patient cooperative and responds to stimulation  Airway & Oxygen Therapy: Patient Spontanous Breathing and Patient connected to face mask oxygen  Post-op Assessment: Report given to RN, Post -op Vital signs reviewed and stable, Patient moving all extremities and Patient moving all extremities X 4  Post vital signs: Reviewed and stable  Last Vitals:  Filed Vitals:   03/18/16 1300 03/18/16 1302  BP:    Pulse: 80 75  Temp:    Resp: 15 16    Last Pain:  Filed Vitals:   03/18/16 1302  PainSc: 3       Patients Stated Pain Goal: 2 (68/03/21 2248)  Complications: No apparent anesthesia complications

## 2016-03-18 NOTE — Addendum Note (Signed)
Addended byLottie Mussel T on: 03/18/2016 10:57 AM   Modules accepted: Orders

## 2016-03-18 NOTE — Anesthesia Preprocedure Evaluation (Signed)
Anesthesia Evaluation  Patient identified by MRN, date of birth, ID band Patient awake    Reviewed: Allergy & Precautions, NPO status , Patient's Chart, lab work & pertinent test results  History of Anesthesia Complications Negative for: history of anesthetic complications  Airway Mallampati: II  TM Distance: >3 FB Neck ROM: Full    Dental  (+) Teeth Intact   Pulmonary shortness of breath, neg sleep apnea, neg COPD, neg recent URI,    breath sounds clear to auscultation       Cardiovascular negative cardio ROS   Rhythm:Regular     Neuro/Psych negative neurological ROS  negative psych ROS   GI/Hepatic negative GI ROS, Neg liver ROS,   Endo/Other  negative endocrine ROS  Renal/GU negative Renal ROS     Musculoskeletal   Abdominal   Peds  Hematology negative hematology ROS (+)   Anesthesia Other Findings   Reproductive/Obstetrics                             Anesthesia Physical Anesthesia Plan  ASA: II  Anesthesia Plan: General   Post-op Pain Management:    Induction: Intravenous  Airway Management Planned: Oral ETT and Double Lumen EBT  Additional Equipment: CVP, Ultrasound Guidance Line Placement and Arterial line  Intra-op Plan:   Post-operative Plan: Extubation in OR  Informed Consent: I have reviewed the patients History and Physical, chart, labs and discussed the procedure including the risks, benefits and alternatives for the proposed anesthesia with the patient or authorized representative who has indicated his/her understanding and acceptance.   Dental advisory given  Plan Discussed with: CRNA and Surgeon  Anesthesia Plan Comments:         Anesthesia Quick Evaluation

## 2016-03-18 NOTE — Interval H&P Note (Signed)
History and Physical Interval Note:  03/18/2016 12:20 PM  Cathy Little  has presented today for surgery, with the diagnosis of RLL MASS  The various methods of treatment have been discussed with the patient and family. After consideration of risks, benefits and other options for treatment, the patient has consented to  Procedure(s): VIDEO BRONCHOSCOPY (N/A) VIDEO ASSISTED THORACOSCOPY (VATS)/RIGHT LOWER LOBECTOMY, POSSIBLE BILOBECTOMY (Right) as a surgical intervention .  The patient's history has been reviewed, patient examined, no change in status, stable for surgery.  I have reviewed the patient's chart and labs.  Questions were answered to the patient's satisfaction.     Melrose Nakayama

## 2016-03-18 NOTE — H&P (View-Only) (Signed)
[address]       [place] [address]             [phone]       HPI: Ms. Cathy Little presents today to review the results of her PFTs and further discuss management of her right lower lobe mass.  Ms. Cathy Little is a 40 year old woman who recently moved to [place] from [place]. She has a history of ovarian cysts and is a lifelong nonsmoker. She presented with a chief complaint of chest pain. She says she experiences chest pain when she is sad or upset and sometimes after eating. It is not exertional. She also complains of some pain along the bottom of her left rib cage. And also sometimes has pain in her right side of her back.   She went to the emergency room after having an episode of the chest pain. A chest x-ray showed a 3 cm right lower lobe mass. A CT confirmed a 3.2 cm right lower lobe mass that was smoothly marginated. There was a question whether this might represent a vascular malformation, so an MR was done. 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  I saw her in the office last week and discussed the workup and treatment with the patient and her brother along with a professional interpreter. We planned to do a bronchoscopy, right VATS, and right lower or bilobectomy. She was to get PFTs and return to answer any questions she might have. The indications, risks and benefits were discussed in detail with her last week.    She denies cough, wheezing, hemoptysis, shortness of breath, weight loss, change in appetite. She works full time in a [employer]. 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 She would notice that when she talked for a long time she would be short of breath and her voice would get weaker.  Past Medical History Ovarian cysts Lung mass  Past Surgical History  Procedure Laterality Date  . Ovarian cyst removal  06/30/2014    MEDICATIONS No current outpatient prescriptions on file.   No current  facility-administered medications for this visit.   NKDA  Physical Exam BP 110/70 mmHg  Pulse 67  Resp 20  Ht '5\' 6"'$  (1.676 m)  Wt 113 lb (51.256 kg)  BMI 18.25 kg/m2  SpO2 99% 40 yo woman in NAD Well developed and well nourished Alert and oriented x 3, no focal deficits No cervical or suprclavicular adenopathy Cardiac RRR, normal S1 and S2, no murmur Lungs clear, no wheezing Abdomen soft, nontender EXT no clubbing, cyanosis or edema  Diagnostic Tests: PFTs FVC= 2.72(84%) FEV1= 2.10 (79%), no change with bronchodilators DLCO= 23.55 (91%)  Impression: 40 year old woman who recently came to [place] from [place]. She had been having some vague chest and right sided neck pain. As part of her workup a chest x-ray showed a right lower lobe mass. This was confirmed by CT chest. There was no hilar or mediastinal adenopathy. An MR was done to rule out a vascular malformation. 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  I recommended to her that we proceed with bronchoscopy, right video-assisted thoracoscopy and right lower or possible bilobectomy. I previously discussed this in detail with her last week using a professional interpreter. She returned today so that I can answer any questions she might have.  Her pulmonary function shows adequate reserve to tolerate the procedure.  She had several questions which I  answered to the best of my ability. She does want to proceed with the resection.  Plan:  Bronchoscopy, Right VATS, right lower lobectomy or possible bilobectomy  Melrose Nakayama, MD [facility] [phone]

## 2016-03-18 NOTE — Anesthesia Postprocedure Evaluation (Signed)
Anesthesia Post Note  Patient: Cathy Little  Procedure(s) Performed: Procedure(s) (LRB): VIDEO BRONCHOSCOPY (N/A) VIDEO ASSISTED THORACOSCOPY (VATS)/RIGHT MIDDLE AND LOWER  BILOBECTOMY  (Right)  Patient location during evaluation: PACU Anesthesia Type: General Level of consciousness: awake, awake and alert and oriented Pain management: pain level controlled Vital Signs Assessment: post-procedure vital signs reviewed and stable Respiratory status: spontaneous breathing Cardiovascular status: blood pressure returned to baseline Anesthetic complications: no    Last Vitals:  Filed Vitals:   03/18/16 1946 03/18/16 2000  BP:  94/62  Pulse:  82  Temp: 36.6 C   Resp:  9    Last Pain:  Filed Vitals:   03/18/16 2012  PainSc: Asleep                 Wessie Shanks COKER

## 2016-03-18 NOTE — Brief Op Note (Addendum)
03/18/2016  5:26 PM  PATIENT:  Cathy Little  40 y.o. female  PRE-OPERATIVE DIAGNOSIS:  RLL MASS  POST-OPERATIVE DIAGNOSIS:  Carcinoid tumor RLL- Clinical stage IB  PROCEDURE:  VIDEO BRONCHOSCOPY,  RIGHT  VIDEO ASSISTED THORACOSCOPY (VATS) THORACOSCOPIC ASSISTED RIGHT MIDDLE AND LOWER  BILOBECTOMY LYMPH NODE DISSECTION ON Q LOCAL ANESTHETIC CATHETER PLACEMENT  SURGEON:  Surgeon(s) and Role:    * Melrose Nakayama, MD - Primary  PHYSICIAN ASSISTANT: Lars Pinks PA-C  ANESTHESIA:   general  EBL:  Total I/O In: 2500 [I.V.:2500] Out: 545 [Urine:245; Blood:300]  BLOOD ADMINISTERED:none  DRAINS: 28 Blake drain and 28 French chest tube placed in the right pleural space   LOCAL MEDICATIONS USED:  BUPIVICAINE   SPECIMEN:  Source of Specimen:  RML and RLL, LYMPH NODES  DISPOSITION OF SPECIMEN:  Pathology. Frozen consistent with carcinoid and margins negative for cancer  COUNTS CORRECT:  YES  PLAN OF CARE: Admit to inpatient   PATIENT DISPOSITION:  PACU - hemodynamically stable.   Delay start of Pharmacological VTE agent (>24hrs) due to surgical blood loss or risk of bleeding: yes

## 2016-03-19 ENCOUNTER — Encounter (HOSPITAL_COMMUNITY): Payer: Self-pay | Admitting: Thoracic Surgery (Cardiothoracic Vascular Surgery)

## 2016-03-19 ENCOUNTER — Inpatient Hospital Stay (HOSPITAL_COMMUNITY): Payer: BLUE CROSS/BLUE SHIELD

## 2016-03-19 LAB — BASIC METABOLIC PANEL
ANION GAP: 4 — AB (ref 5–15)
BUN: <5 mg/dL — ABNORMAL LOW (ref 6–20)
CALCIUM: 8.7 mg/dL — AB (ref 8.9–10.3)
CHLORIDE: 106 mmol/L (ref 101–111)
CO2: 23 mmol/L (ref 22–32)
Creatinine, Ser: 0.49 mg/dL (ref 0.44–1.00)
GFR calc non Af Amer: >60 mL/min (ref >60)
GLUCOSE: 138 mg/dL — AB (ref 65–99)
Potassium: 4.1 mmol/L (ref 3.5–5.1)
Sodium: 133 mmol/L — ABNORMAL LOW (ref 135–145)

## 2016-03-19 LAB — TYPE AND SCREEN
ABO/RH(D): O POS
Antibody Screen: NEGATIVE
UNIT DIVISION: 0
Unit division: 0

## 2016-03-19 LAB — GLUCOSE, CAPILLARY
GLUCOSE-CAPILLARY: 103 mg/dL — AB (ref 65–99)
GLUCOSE-CAPILLARY: 183 mg/dL — AB (ref 65–99)
GLUCOSE-CAPILLARY: 98 mg/dL (ref 65–99)
Glucose-Capillary: 126 mg/dL — ABNORMAL HIGH (ref 65–99)
Glucose-Capillary: 104 mg/dL — ABNORMAL HIGH (ref 65–99)

## 2016-03-19 LAB — CBC
HEMATOCRIT: 32.8 % — AB (ref 36.0–46.0)
HEMOGLOBIN: 11 g/dL — AB (ref 12.0–15.0)
MCH: 31.2 pg (ref 26.0–34.0)
MCHC: 33.5 g/dL (ref 30.0–36.0)
MCV: 92.9 fL (ref 78.0–100.0)
Platelets: 173 10*3/uL (ref 150–400)
RBC: 3.53 MIL/uL — ABNORMAL LOW (ref 3.87–5.11)
RDW: 13.5 % (ref 11.5–15.5)
WBC: 9.1 10*3/uL (ref 4.0–10.5)

## 2016-03-19 MED ORDER — KETAMINE HCL 100 MG/ML IJ SOLN
INTRAMUSCULAR | Status: AC
  Filled 2016-03-19: qty 1

## 2016-03-19 MED ORDER — ENOXAPARIN SODIUM 40 MG/0.4ML ~~LOC~~ SOLN
40.0000 mg | SUBCUTANEOUS | Status: DC
  Administered 2016-03-19 – 2016-04-04 (×16): 40 mg via SUBCUTANEOUS
  Filled 2016-03-19 (×17): qty 0.4

## 2016-03-19 MED ORDER — PROPOFOL 1000 MG/100ML IV EMUL
INTRAVENOUS | Status: AC
  Filled 2016-03-19: qty 300

## 2016-03-19 MED ORDER — CETYLPYRIDINIUM CHLORIDE 0.05 % MT LIQD
7.0000 mL | Freq: Two times a day (BID) | OROMUCOSAL | Status: DC
  Administered 2016-03-19 – 2016-03-21 (×2): 7 mL via OROMUCOSAL

## 2016-03-19 MED ORDER — INSULIN ASPART 100 UNIT/ML ~~LOC~~ SOLN: 0.0000 [IU] | Freq: Three times a day (TID) | SUBCUTANEOUS | Status: DC

## 2016-03-19 NOTE — Op Note (Signed)
Cathy Little, Cathy Little NO.:  [id number]  MEDICAL RECORD NO.:  [id number]  LOCATION:  3S13C                        FACILITY:  [facility]  PHYSICIAN:  Revonda Standard. Roxan Hockey, M.D.DATE OF BIRTH:  1976/04/04  DATE OF PROCEDURE:  03/18/2016 DATE OF DISCHARGE:                              OPERATIVE REPORT   PREOPERATIVE DIAGNOSIS:  Right lower lobe mass.  POSTOPERATIVE DIAGNOSIS:  Carcinoid tumor, right lower lobe.  PROCEDURE:   Bronchoscopy, Right video-assisted thoracoscopy, Thoracoscopic right middle and lower bilobectomy, Lymph node dissection, On-Q local anesthetic catheter placement.  SURGEON:  Revonda Standard. Roxan Hockey, M.D.  ASSISTANT:  Lars Pinks, PA.  ANESTHESIA:  General.  FINDINGS:  Bronchoscopy revealed a mass at the origin of the right lower lobe bronchus involving the superior segmental and basilar segmental bronchi.  There was no direct involvement of the right middle lobe bronchus, but there would be no way to preserve the right middle lobe bronchus and achieve an adequate margin.  Bronchial margin clear of tumor.  Frozen section of mass revealed a carcinoid tumor.  CLINICAL NOTE:  Cathy Little is a 40 year old woman, who recently moved here from [place].  She had a chest x-ray which revealed a right hilar mass.  On CT scan, there was a 3 cm mass in the right lower lobe near the takeoff of the bronchus.  She was advised to undergo bronchoscopy and then right thoracoscopic resection.  It was felt this would likely require a bilobectomy, but would limit to a right lower lobectomy if possible.  The indications, risks, benefits, and alternatives were discussed in detail with the patient on 2 occasions.  She understood and accepted the risks and agreed to proceed.  OPERATIVE NOTE:  Cathy Little was brought to the operating room on Mar 18, 2016.  She was taken to the preoperative holding area.  Anesthesia placed a central line and an arterial blood  pressure monitoring line. She was taken to the operating room, anesthetized, and intubated with a single-lumen endotracheal tube.  Flexible fiberoptic bronchoscopy was performed via the endotracheal tube.  There was a tumor at the origin of the right lower lobe bronchus involving the superior segmental and basilar segmental bronchi.  Biopsies were taken and sent for frozen section.  The middle lobe bronchus was in close proximity but was not directly involved, however, it was apparent that preserving the middle lobe would be unlikely to result in an adequate surgical margin.  The bronchoscope was removed.  The patient was reintubated with a double- lumen endotracheal tube.  A Foley catheter was placed.  Intravenous antibiotics were administered.  She was placed in a left lateral decubitus position, and the right chest was prepped and draped in usual sterile fashion.  Single lung ventilation of the left lung was initiated and was tolerated well throughout the procedure.  An incision was made in the seventh intercostal space in the midaxillary line.  A 5 mm port was inserted, and the thoracoscope was advanced into the chest.  There was good isolation of the right lung.  A second port incision was made anterior to this for instrumentation, and a 5 cm working incision was made in the fourth  interspace anterolaterally.  No rib spreading was performed during the procedure.  The inferior ligament was divided with electrocautery.  No lymph node was identified.  The pleural reflection was divided at the hilum medially and anteriorly and posteriorly.  The mass was palpable centrally in the lower lobe, and it was felt that preserving the middle lobe would not be possible.  The inferior pulmonary vein was identified.  It was dissected out, encircled and divided with an endoscopic vascular stapler.  The middle lobe veins were divided separately.  The minor fissure was nearly complete.  It did require  firing of the stapler to complete the minor fissure.  The major fissure between the upper lobe and superior segment was incomplete.  The dissection was begun in the midportion at the confluence of the 2 fissures.  The pulmonary artery was identified and dissection was carried across the superior segmental branch to the lower lobe. It and the posterior ascending branch of the upper lobe came off the main pulmonary artery as a common trunk and then separated as T.  After this was identified and the dissection was carried down between these 2 branches, the fissure was completed with sequential firings of the endoscopic Echelon 45 mm stapler with yellow cartridges.  While doing this, a large level 7 lymph node was visible.  All lymph nodes that were encountered during the dissection of the vessels were removed and sent for permanent pathology.  The level 7 node was removed using the Harmonic scalpel.  It was sent as a separate specimen.  The middle lobe artery branch then was dissected out and divided with the endoscopic vascular stapler followed by the superior segmental branches of the lower lobe pulmonary artery which was divided separately as well.  This allowed encircling the basilar segmental arterial branches near their origin, and again these were divided with a single firing of the endoscopic vascular stapler.  The lymph nodes around the bronchus were removed.  An Echelon 45 mm stapler with a green cartridge was placed across the bronchus intermedius and closed.  Test inflation showed good aeration of the right upper lobe, and the stapler was fired transecting the bronchus.  The specimen was placed into an endoscopic retrieval bag and removed through the working incision.  It was sent for frozen section of the mass and margins, returned showing a carcinoid tumor with a clear bronchial margin.  The chest was copiously irrigated with warm saline.  Hemostasis was achieved.  A test  inflation at 30 cm of water revealed some leakage from the fissure dissection, but no leakage from the bronchial stump.  Progel was applied to the area of leak.  An On-Q local anesthetic catheter was tunneled into a subpleural location and primed with 5 mL of 0.5% Marcaine.  A 28-French chest tube was placed through the original port incision, and a 28-French chest tube was placed through the anterior port incision.  Both were secured to skin with #1 silk sutures.  The right upper lobe was reinflated with the thoracoscope through the incision, there was good aeration of the lung.  The scope was removed. The working incision was closed with #1 Vicryl fascial suture, followed by 2-0 Vicryl subcutaneous suture and a 3-0 Vicryl subcuticular suture. All sponge, needle, and instrument counts were correct at the end of the procedure.  The patient was extubated in the operating room and taken to the postanesthetic care unit in good condition.     Revonda Standard Roxan Hockey, M.D.  SCH/MEDQ  D:  03/18/2016  T:  03/19/2016  Job:  [id number]

## 2016-03-19 NOTE — Progress Notes (Signed)
1 Day Post-Op Procedure(s) (LRB): VIDEO BRONCHOSCOPY (N/A) VIDEO ASSISTED THORACOSCOPY (VATS)/RIGHT MIDDLE AND LOWER  BILOBECTOMY  (Right) Subjective: Some incisional pain  Objective: Vital signs in last 24 hours: Temp:  [97.7 F (36.5 C)-98.9 F (37.2 C)] 97.7 F (36.5 C) (06/01 0739) Pulse Rate:  [68-100] 71 (06/01 0739) Cardiac Rhythm:  [-] Normal sinus rhythm (06/01 0739) Resp:  [9-29] 23 (06/01 0739) BP: (87-109)/(60-85) 102/85 mmHg (06/01 0739) SpO2:  [100 %] 100 % (06/01 0739) Arterial Line BP: (94-148)/(54-79) 133/70 mmHg (06/01 0739) FiO2 (%):  [2 %-39 %] 2 % (06/01 0731) Weight:  [110 lb 3 oz (49.981 kg)] 110 lb 3 oz (49.981 kg) (05/31 1035)  Hemodynamic parameters for last 24 hours:    Intake/Output from previous day: 05/31 0701 - 06/01 0700 In: 4155 [I.V.:4105; IV Piggyback:50] Out: 9767 [Urine:595; Blood:300; Chest Tube:450] Intake/Output this shift: Total I/O In: -  Out: 440 [Urine:400; Chest Tube:40]  General appearance: alert, cooperative and no distress Neurologic: intact Heart: regular rate and rhythm Lungs: clear to auscultation bilaterally Abdomen: normal findings: soft, non-tender + air leak  Lab Results:  Recent Labs  03/17/16 1313 03/19/16 0645  WBC 3.0* 9.1  HGB 12.7 11.0*  HCT 38.1 32.8*  PLT 209 173   BMET:  Recent Labs  03/17/16 1313 03/19/16 0645  NA 137 133*  K 3.9 4.1  CL 109 106  CO2 20* 23  GLUCOSE 91 138*  BUN 7 <5*  CREATININE 0.55 0.49  CALCIUM 9.5 8.7*    PT/INR:  Recent Labs  03/17/16 1313  LABPROT 16.4*  INR 1.31   ABG    Component Value Date/Time   PHART 7.418 03/17/2016 1313   HCO3 21.1 03/17/2016 1313   TCO2 22.1 03/17/2016 1313   ACIDBASEDEF 2.7* 03/17/2016 1313   O2SAT 98.3 03/17/2016 1313   CBG (last 3)   Recent Labs  03/18/16 2345 03/19/16 0507  GLUCAP 183* 126*    Assessment/Plan: S/P Procedure(s) (LRB): VIDEO BRONCHOSCOPY (N/A) VIDEO ASSISTED THORACOSCOPY (VATS)/RIGHT MIDDLE AND  LOWER  BILOBECTOMY  (Right) -  POD # 1 bilobectomy for carcinoid tumor Pain well controlled with PCA Has an air leak- will leave CT to suction today CXR shows a small space Ambulate SCD + enoxaparin for DVT prophylaxis    LOS: 1 day    Melrose Nakayama 03/19/2016

## 2016-03-20 ENCOUNTER — Inpatient Hospital Stay (HOSPITAL_COMMUNITY): Payer: BLUE CROSS/BLUE SHIELD

## 2016-03-20 LAB — CERVICOVAGINAL ANCILLARY ONLY: Herpes: NEGATIVE

## 2016-03-20 LAB — CBC
HCT: 33.7 % — ABNORMAL LOW (ref 36.0–46.0)
HEMOGLOBIN: 11.1 g/dL — AB (ref 12.0–15.0)
MCH: 31 pg (ref 26.0–34.0)
MCHC: 32.9 g/dL (ref 30.0–36.0)
MCV: 94.1 fL (ref 78.0–100.0)
Platelets: 169 10*3/uL (ref 150–400)
RBC: 3.58 MIL/uL — AB (ref 3.87–5.11)
RDW: 13.7 % (ref 11.5–15.5)
WBC: 6.4 10*3/uL (ref 4.0–10.5)

## 2016-03-20 LAB — COMPREHENSIVE METABOLIC PANEL
ALK PHOS: 48 U/L (ref 38–126)
ALT: 16 U/L (ref 14–54)
AST: 19 U/L (ref 15–41)
Albumin: 2.9 g/dL — ABNORMAL LOW (ref 3.5–5.0)
Anion gap: 2 — ABNORMAL LOW (ref 5–15)
BUN: <5 mg/dL — ABNORMAL LOW (ref 6–20)
CALCIUM: 8.7 mg/dL — AB (ref 8.9–10.3)
CO2: 27 mmol/L (ref 22–32)
CREATININE: 0.55 mg/dL (ref 0.44–1.00)
Chloride: 109 mmol/L (ref 101–111)
Glucose, Bld: 90 mg/dL (ref 65–99)
Potassium: 3.9 mmol/L (ref 3.5–5.1)
Sodium: 138 mmol/L (ref 135–145)
Total Bilirubin: 0.6 mg/dL (ref 0.3–1.2)
Total Protein: 5.7 g/dL — ABNORMAL LOW (ref 6.5–8.1)

## 2016-03-20 LAB — BLOOD GAS, ARTERIAL
ACID-BASE DEFICIT: 1 mmol/L (ref 0.0–2.0)
Bicarbonate: 23.3 mEq/L (ref 20.0–24.0)
O2 CONTENT: 2 L/min
O2 Saturation: 99.4 %
PCO2 ART: 39.9 mmHg (ref 35.0–45.0)
PO2 ART: 169 mmHg — AB (ref 80.0–100.0)
Patient temperature: 98.6
TCO2: 24.6 mmol/L (ref 0–100)
pH, Arterial: 7.385 (ref 7.350–7.450)

## 2016-03-20 LAB — GLUCOSE, CAPILLARY
GLUCOSE-CAPILLARY: 82 mg/dL (ref 65–99)
Glucose-Capillary: 109 mg/dL — ABNORMAL HIGH (ref 65–99)

## 2016-03-20 MED ORDER — POTASSIUM CHLORIDE 10 MEQ/50ML IV SOLN
10.0000 meq | INTRAVENOUS | Status: AC
  Administered 2016-03-20 (×3): 10 meq via INTRAVENOUS
  Filled 2016-03-20 (×3): qty 50

## 2016-03-20 MED ORDER — POTASSIUM CHLORIDE CRYS ER 10 MEQ PO TBCR: 30.0000 meq | EXTENDED_RELEASE_TABLET | Freq: Once | ORAL | Status: DC

## 2016-03-20 NOTE — Discharge Instructions (Signed)
Thoracoscopy, Care After Refer to this sheet in the next few weeks. These instructions provide you with information about caring for yourself after your procedure. Your health care provider may also give you more specific instructions. Your treatment has been planned according to current medical practices, but problems sometimes occur. Call your health care provider if you have any problems or questions after your procedure. WHAT TO EXPECT AFTER THE PROCEDURE: After your procedure, it is common to feel sore for up to two weeks. HOME CARE INSTRUCTIONS  There are many different ways to close and cover an incision, including stitches (sutures), skin glue, and adhesive strips. Follow your health care provider's instructions about:  Incision care.  Bandage (dressing) changes and removal.  Incision closure removal.  Check your incision area every day for signs of infection. Watch for:  Redness, swelling, or pain.  Fluid, blood, or pus.  Take medicines only as directed by your health care provider.  Try to cough often. Coughing helps to protect against lung infection (pneumonia). It may hurt to cough. If this happens, hold a pillow against your chest when you cough.  Take deep breaths. This also helps to protect against pneumonia.  If you were given an incentive spirometer, use it as directed by your health care provider.  Do not take baths, swim, or use a hot tub until your health care provider approves. You may take showers.  Avoid lifting until your health care provider approves.  Avoid driving until your health care provider approves.  Do not travel by airplane after the chest tube is removed until your health care provider approves. SEEK MEDICAL CARE IF:  You have a fever.  Pain medicines do not ease your pain.  You have redness, swelling, or increasing pain in your incision area.  You develop a cough that does not go away, or you are coughing up mucus that is yellow or  green. SEEK IMMEDIATE MEDICAL CARE IF:  You have fluid, blood, or pus coming from your incision.  There is a bad smell coming from your incision or dressing.  You develop a rash.  You have difficulty breathing.  You cough up blood.  You develop light-headedness or you feel faint.  You develop chest pain.  Your heartbeat feels irregular or very fast.   This information is not intended to replace advice given to you by your health care provider. Make sure you discuss any questions you have with your health care provider.   Document Released: 04/24/2005 Document Revised: 10/26/2014 Document Reviewed: 06/20/2014 Elsevier Interactive Patient Education [employer].

## 2016-03-20 NOTE — Discharge Summary (Signed)
Physician Discharge Summary       [address]       [place] [address]             [phone]    Patient ID: Cathy Little MRN: [id number] DOB/AGE: 1976-08-02 40 y.o.  Admit date: 03/18/2016 Discharge date: 04/05/2016  Admission Diagnosis: Right lower lobe mass  Acive Diagnoses:  1. Carcinoid tumor right lower lobe 2. Ovarian cysts  Procedure (s):  Bronchoscopy, right video-assisted thoracoscopy, thoracoscopic right middle and lower bilobectomy, lymph node dissection, On-Q local anesthetic catheter placement by Dr. Roxan Hockey on 03/18/2016.  Pathology:  1. Lung, biopsy, Right Lower Lobe - CRUSHED SCANT ATYPICAL CELLS. 2. Lung, resection (segmental or lobe), right middle and lower lobe - WELL DIFFERENTIATED NEUROENDOCRINE TUMOR (TYPICAL CARCINOID), SPANNING 3.0 CM. - TUMOR INVOLVES LOBAR BRONCHUS. - BRONCHIAL AND VASCULAR RESECTION MARGINS ARE NEGATIVE. - SEE ONCOLOGY TABLE. 3. Lymph node, biopsy, 11R - ONE OF ONE LYMPH NODES NEGATIVE FOR TUMOR (0/1). 4. Lymph node, biopsy, Right - ONE OF ONE LYMPH NODES NEGATIVE FOR TUMOR (0/1). 5. Lymph node, biopsy, 11R #2 - ONE OF ONE LYMPH NODES NEGATIVE FOR TUMOR (0/1).  History of Presenting Illness: This is a 40 year old woman with a history of ovarian cysts and is a lifelong nonsmoker. She recently moved here from [place]. She speaks primarily [place] and is accompanied by a professional an interpreter, as well as her brother who speaks [place].  She was in her usual state of health until recently when she began having pain in her chest. She says she experiences chest pain when she is sad or upset and sometimes after eating. It is not exertional. She also complains of some pain along the bottom of her left rib cage. And also sometimes has pain in her right side of her back. She went to the emergency room after having an episode of the chest pain. A chest x-ray showed a right lower lobe mass. A CT was done which  showed a 3.2 cm right lower lobe mass that was marginated. There is a question of whether this might represent vascular malformations to an MR was done. 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  She denies cough, wheezing, hemoptysis, shortness of breath, weight loss, change in appetite. She works full time in a [employer].  Dr. Roxan Hockey discussed the need for bronchoscopy, right VATS, and possibly lobectomy or bilobectomy. Potential risks, benefits, and complications were discussed with the patient and she agreed to proceed with surgery. She underwent the aforementioned surgery on 03/18/2016.  Brief Hospital Course:  She remained afebrile and hemodynamically stable. A line and foley were removed early in her post operative course. Chest tube output gradually decreased. She had a small air leak, which was persistent.  Her chest tube was placed on water seal but she did not tolerate this.  CXR showed development of a moderate sized pneumothorax.  Her chest tube was therefore placed back on suction.  Pneumothorax resolved, but air leak persisted.  It was felt placement of Intrabronchial valves would be indicated.  She was taken back to the OR on 04/01/2016.  She underwent placement of IBV in right upper lobe segmental bronchi.  She tolerated the procedure without difficulty.  Her chest tube remained on suction and did not have an air leak.  Her chest tube was placed on water seal on POD #1.  Follow up CXR showed stable apical space and there was no  air leak present.  Her chest tube was removed without difficulty.  Post chest tube removal CXR was obtained and showed a pleural space.   The patient continued to do well.  Her pain was well controlled.  She is maintaining good oxygenation levels off oxygen.  She was ambulating without difficulty.  She was felt medically stable for discharge home today.  Latest Vital Signs: Blood pressure 93/69, pulse 85,  temperature 97.7 F (36.5 C), temperature source Oral, resp. rate 22, height '5\' 6"'$  (1.676 m), weight 103 lb 9.9 oz (47 kg), last menstrual period 03/01/2016, SpO2 100 %.  Physical Exam: Cardiovascular: RRR Pulmonary: Clear to auscultation on right and slightly diminished left base Abdomen: Soft, non tender, bowel sounds present. Extremities: No LE edema Wounds: Clean and dry. No erythema or signs of infection. Chest Tubes: To suction, small air leak  Discharge Condition:Stable and discharged to home.  Recent laboratory studies:  Lab Results  Component Value Date   WBC 5.5 03/26/2016   HGB 11.9* 03/26/2016   HCT 35.6* 03/26/2016   MCV 92.5 03/26/2016   PLT 283 03/26/2016   Lab Results  Component Value Date   NA 137 03/26/2016   K 3.6 03/26/2016   CL 100* 03/26/2016   CO2 30 03/26/2016   CREATININE 0.46 03/26/2016   GLUCOSE 103* 03/26/2016    Diagnostic Studies:  Mr Chest W Wo Contrast  02/26/2016  CLINICAL DATA:  40 year old female with recently diagnosed mass in the right hemithorax. Followup evaluation. EXAM: MRI CHEST WITHOUT AND WITH CONTRAST TECHNIQUE: Multiplanar multi sequence pre and postcontrast imaging of the thorax was obtained per routine protocol before and after the administration of IV gadolinium. CONTRAST:  10 mL of MultiHance COMPARISON:  Chest CT 02/07/2016. FINDINGS: In the perihilar aspect of the right lower lobe there is a well-circumscribed ovoid lesion which measures 2.5 x 3.2 x 2.3 cm. This lesion demonstrates high signal intensity on both precontrast T1 and T2 weighted images, and demonstrates avid enhancement on post gadolinium imaging. Notably, this lesion does not demonstrate flow voids on black blood imaging. This lesion appears intimately associated with the right lower lobe bronchi. Although intimately associated with the adjacent vessels, the vessels appear to deviate around the lesion, and do not appear to be significantly enlarged or tortuous as  would be expected in the setting of an arteriovenous malformation. No definite lymphadenopathy noted in the mediastinum. Remaining portions of the visualized thorax are otherwise unremarkable. IMPRESSION: 1. Avidly enhancing lesion in the central aspect of the right lower lobe which appears intimately associated with the right lower lobe bronchi. 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 This does not appear to be a vascular malformation, but this lesion does avidly enhance. Confirmation of carcinoid tumor could be obtained with nuclear medicine MIBG scan, or tissue sampling could be performed via bronchoscopy. Electronically Signed   By: Vinnie Langton M.D.   On: 02/26/2016 16:51   Mr Neck Soft Tissue Only W Wo Contrast  02/26/2016  CLINICAL DATA:  Patient with history of lung cancer and neck cancer without treatment. Diagnosis in [place]. Right perihilar chest mass, likely carcinoid tumor. EXAM: MR NECK SOFT TISSUE ONLY WITHOUT AND WITH CONTRAST TECHNIQUE: Multiplanar, multisequence MR imaging was performed both before and after administration of intravenous contrast. CONTRAST:  60m MULTIHANCE GADOBENATE DIMEGLUMINE 529 MG/ML IV SOLN COMPARISON:  MRI of the chest from the same day. FINDINGS: No focal mucosal or submucosal lesions are present.  The tongue base is normal. Vocal cords are midline and symmetric. The thyroid is within normal limits. Salivary glands are unremarkable. No significant cervical adenopathy is present. Lung apices are clear. IMPRESSION: Negative MRI of the neck. No evidence for head and neck cancer or adenopathy. Electronically Signed   By: San Morelle M.D.   On: 02/26/2016 17:09   Dg Chest Port 1 View  03/20/2016  CLINICAL DATA:  Status post partial right lobectomy 2 days ago for a lower lobe lung mass EXAM: PORTABLE CHEST 1 VIEW COMPARISON:  Portable chest x-ray of March 19, 2016 FINDINGS: The small right apical pneumothorax  is not clearly evident. The 2 right-sided chest tubes are in stable position. There is minimal density at the right lung base consistent with atelectasis. There is mild shift of mediastinum toward the right. The left lung is hyperinflated and clear. The heart and pulmonary vascularity are normal. The right internal jugular venous catheter tip projects over the midportion of the SVC. The bony thorax is unremarkable. IMPRESSION: No definite residual pneumothorax on the right. Minimal right basilar atelectasis, stable. The support tubes are in stable position. Electronically Signed   By: David  Martinique M.D.   On: 03/20/2016 07:50   Discharge Medications:   Medication List    TAKE these medications        acetaminophen 500 MG tablet  Commonly known as:  TYLENOL  Take 2 tablets (1,000 mg total) by mouth every 6 (six) hours as needed for mild pain, fever or headache.     oxyCODONE 5 MG immediate release tablet  Commonly known as:  Oxy IR/ROXICODONE  Take 1-2 tablets (5-10 mg total) by mouth every 4 (four) hours as needed for severe pain.     polyethylene glycol packet  Commonly known as:  MIRALAX / GLYCOLAX  Take 17 g by mouth daily. Had a prescription for two weeks in  ED- did not work well        Follow Up Appointments: Follow-up Information    Follow up with Melrose Nakayama, MD On 04/14/2016.   Specialty:  Cardiothoracic Surgery   Why:  PA/LAT CXR to be taken (at [facility] which is in the same building as Dr. Leonarda [facility] office) on 03/20/2016 at 2:15 pm;Appointment time is at 2:45 pm   Contact information:   [place] [address] [phone]       Signed: Cinda Quest 04/05/2016, 10:21 AM

## 2016-03-20 NOTE — Progress Notes (Addendum)
      [address]       [place] [address]             [phone]       2 Days Post-Op Procedure(s) (LRB): VIDEO BRONCHOSCOPY (N/A) VIDEO ASSISTED THORACOSCOPY (VATS)/RIGHT MIDDLE AND LOWER  BILOBECTOMY  (Right)  Subjective: Patient has some incisional pain  Objective: Vital signs in last 24 hours: Temp:  [97.6 F (36.4 C)-98.4 F (36.9 C)] 98.3 F (36.8 C) (06/02 0727) Pulse Rate:  [69-95] 76 (06/02 0727) Cardiac Rhythm:  [-] Normal sinus rhythm (06/02 0800) Resp:  [10-30] 16 (06/02 0727) BP: (91-103)/(60-77) 103/68 mmHg (06/02 0727) SpO2:  [99 %-100 %] 100 % (06/02 0727)     Intake/Output from previous day: 06/01 0701 - 06/02 0700 In: 2155 [P.O.:850; I.V.:1255; IV Piggyback:50] Out: 2130 [Urine:3325; Chest Tube:830]   Physical Exam:  Cardiovascular: RRR Pulmonary: Clear to auscultation on right and slightly diminished left base Abdomen: Soft, non tender, bowel sounds present. Extremities: No LE edema Wounds: Clean and dry.  No erythema or signs of infection. Chest Tubes: To suction, small air leak  Lab Results: CBC: Recent Labs  03/19/16 0645 03/20/16 0310  WBC 9.1 6.4  HGB 11.0* 11.1*  HCT 32.8* 33.7*  PLT 173 169   BMET:  Recent Labs  03/19/16 0645 03/20/16 0310  NA 133* 138  K 4.1 3.9  CL 106 109  CO2 23 27  GLUCOSE 138* 90  BUN <5* <5*  CREATININE 0.49 0.55  CALCIUM 8.7* 8.7*    PT/INR:  Recent Labs  03/17/16 1313  LABPROT 16.4*  INR 1.31   ABG:  INR: Will add last result for INR, ABG once components are confirmed Will add last 4 CBG results once components are confirmed  Assessment/Plan:  1. CV - SR in the 70's. 2.  Pulmonary - Chest tube output with 830 cc of output last 24 hours. Chest tubes are to suction. There is a small air leak CXR this am shows no pneumothorax, right base atelectasis. Encourage incentive spirometer 3. Anemia-H and H stable at 11.1 and 33.7 4. Supplement potassium 5. Decrease  IVF  ZIMMERMAN,DONIELLE MPA-C 03/20/2016,8:55 AM  Patient seen and examined, agree with above She looks great Still has an air leak and relatively high output from CT- will keep on suction today  Path still pending  Remo Lipps C. Roxan Hockey, MD [facility] [phone]

## 2016-03-20 NOTE — Care Management Note (Signed)
Case Management Note  Patient Details  Name: Cathy Little MRN: [id number] Date of Birth: 03/03/1976  Subjective/Objective:  Patient is from home with family, s/p vats, speaks [place], has chest tubes x 2 with still quite a bit of drainage.  NCM will cont to follow for dc needs.                  Action/Plan:   Expected Discharge Date:  03/25/16               Expected Discharge Plan:  Home/Self Care  In-House Referral:     Discharge planning Services  CM Consult  Post Acute Care Choice:    Choice offered to:     DME Arranged:    DME Agency:     HH Arranged:    HH Agency:     Status of Service:  Completed, signed off  Medicare Important Message Given:    Date Medicare IM Given:    Medicare IM give by:    Date Additional Medicare IM Given:    Additional Medicare Important Message give by:     If discussed at [place] of Stay Meetings, dates discussed:    Additional Comments:  Zenon Mayo, RN 03/20/2016, 5:53 PM

## 2016-03-21 ENCOUNTER — Inpatient Hospital Stay (HOSPITAL_COMMUNITY): Payer: BLUE CROSS/BLUE SHIELD

## 2016-03-21 NOTE — Progress Notes (Addendum)
      Cathy Little       Cathy Little,Cathy Little [id number]             [phone]      3 Days Post-Op Procedure(s) (LRB): VIDEO BRONCHOSCOPY (N/A) VIDEO ASSISTED THORACOSCOPY (VATS)/RIGHT MIDDLE AND LOWER  BILOBECTOMY  (Right)   Subjective:  Cathy Little is doing okay.  Her husband is at bedside to provide translation.  + BM  Objective: Vital signs in last 24 hours: Temp:  [97.6 F (36.4 C)-98.9 F (37.2 C)] 97.6 F (36.4 C) (06/03 0758) Pulse Rate:  [70-87] 77 (06/03 0758) Cardiac Rhythm:  [-] Normal sinus rhythm (06/03 0758) Resp:  [11-22] 11 (06/03 0758) BP: (103-114)/(71-80) 103/72 mmHg (06/03 0758) SpO2:  [100 %] 100 % (06/03 0758)  Intake/Output from previous day: 06/02 0701 - 06/03 0700 In: 1433.3 [P.O.:960; I.V.:323.3; IV Piggyback:150] Out: 2000 [Urine:1500; Chest Tube:500] Intake/Output this shift: Total I/O In: 20 [I.V.:20] Out: 425 [Urine:425]  General appearance: alert, cooperative and no distress Heart: regular rate and rhythm Lungs: clear to auscultation bilaterally Abdomen: soft, non-tender; bowel sounds normal; no masses,  no organomegaly Wound: clean and dry  Lab Results:  Recent Labs  03/19/16 0645 03/20/16 0310  WBC 9.1 6.4  HGB 11.0* 11.1*  HCT 32.8* 33.7*  PLT 173 169   BMET:  Recent Labs  03/19/16 0645 03/20/16 0310  NA 133* 138  K 4.1 3.9  CL 106 109  CO2 23 27  GLUCOSE 138* 90  BUN <5* <5*  CREATININE 0.49 0.55  CALCIUM 8.7* 8.7*    PT/INR: No results for input(s): LABPROT, INR in the last 72 hours. ABG    Component Value Date/Time   PHART 7.385 03/19/2016 0650   HCO3 23.3 03/19/2016 0650   TCO2 24.6 03/19/2016 0650   ACIDBASEDEF 1.0 03/19/2016 0650   O2SAT 99.4 03/19/2016 0650   CBG (last 3)   Recent Labs  03/19/16 2112 03/20/16 0818 03/20/16 1103  GLUCAP 104* 82 109*    Assessment/Plan: S/P Procedure(s) (LRB): VIDEO BRONCHOSCOPY (N/A) VIDEO ASSISTED THORACOSCOPY (VATS)/RIGHT MIDDLE AND LOWER   BILOBECTOMY  (Right)  1. Chest tube- output remains high but is improving, 500 cc output yesterday, 1+ air leak with cough... Will leave chest tube to suction today 2. Pain- well controlled 3. Pathology- show Neuroendocrine tumor 4. Dispo- patient stable, leave chest tube to suction today, continue current care   LOS: 3 days    BARRETT, ERIN 03/21/2016  I have seen and examined the patient and agree with the assessment and plan as outlined.  D/C anterior tube but leave posterior tube until drainage has resolved.  Rexene Alberts, MD 03/21/2016 11:37 AM

## 2016-03-22 ENCOUNTER — Inpatient Hospital Stay (HOSPITAL_COMMUNITY): Payer: BLUE CROSS/BLUE SHIELD

## 2016-03-22 NOTE — Progress Notes (Addendum)
      [address]       [place] [address]             [phone]      4 Days Post-Op Procedure(s) (LRB): VIDEO BRONCHOSCOPY (N/A) VIDEO ASSISTED THORACOSCOPY (VATS)/RIGHT MIDDLE AND LOWER  BILOBECTOMY  (Right)   Subjective:  Cathy Little is doing well.  Her brother provides translation.  She notices her pain is better after removal of a chest tube yesterday.  Objective: Vital signs in last 24 hours: Temp:  [97.8 F (36.6 C)-98.8 F (37.1 C)] 97.8 F (36.6 C) (06/04 0712) Pulse Rate:  [70-85] 85 (06/04 0712) Cardiac Rhythm:  [-] Normal sinus rhythm (06/04 0810) Resp:  [15-22] 20 (06/04 0753) BP: (95-101)/(61-71) 99/71 mmHg (06/04 0712) SpO2:  [100 %] 100 % (06/04 0753)  Intake/Output from previous day: 06/03 0701 - 06/04 0700 In: 718.3 [P.O.:480; I.V.:238.3] Out: 1855 [Urine:1725; Chest Tube:130] Intake/Output this shift: Total I/O In: -  Out: 80 [Chest Tube:80]  General appearance: alert, cooperative and no distress Heart: regular rate and rhythm Lungs: diminished breath sounds on right Abdomen: soft, non-tender; bowel sounds normal; no masses,  no organomegaly Wound: clean and dry  Lab Results:  Recent Labs  03/20/16 0310  WBC 6.4  HGB 11.1*  HCT 33.7*  PLT 169   BMET:  Recent Labs  03/20/16 0310  NA 138  K 3.9  CL 109  CO2 27  GLUCOSE 90  BUN <5*  CREATININE 0.55  CALCIUM 8.7*    PT/INR: No results for input(s): LABPROT, INR in the last 72 hours. ABG    Component Value Date/Time   PHART 7.385 03/19/2016 0650   HCO3 23.3 03/19/2016 0650   TCO2 24.6 03/19/2016 0650   ACIDBASEDEF 1.0 03/19/2016 0650   O2SAT 99.4 03/19/2016 0650   CBG (last 3)   Recent Labs  03/19/16 2112 03/20/16 0818 03/20/16 1103  GLUCAP 104* 82 109*    Assessment/Plan: S/P Procedure(s) (LRB): VIDEO BRONCHOSCOPY (N/A) VIDEO ASSISTED THORACOSCOPY (VATS)/RIGHT MIDDLE AND LOWER  BILOBECTOMY  (Right)  1. Chest tube- anterior tube removed  yesterday,  + air leak with cough 130cc output yesterday 2. Pulm- CXR shows increase in pneumothorax to 50%, however would expect some space with bilobectomy, will review film with Dr. Roxy Manns 3. Dispo- patient stable, chest tube on water seal, increase in pneumothorax per CXR... Will discuss further management with Dr. Roxy Manns    LOS: 4 days    Little, Cathy Panning 03/22/2016  I have seen and examined the patient and agree with the assessment and plan as outlined.  Chest tube output decreased.  Size of right PTX (space) has increased since anterior tube removed and remaining tube on water seal, and RUL is not up to apex.  Will replace tube to suction.  Rexene Alberts, MD 03/22/2016 2:33 PM

## 2016-03-23 ENCOUNTER — Inpatient Hospital Stay (HOSPITAL_COMMUNITY): Payer: BLUE CROSS/BLUE SHIELD

## 2016-03-23 NOTE — Progress Notes (Addendum)
      [address]       [place] [address]             [phone]      5 Days Post-Op Procedure(s) (LRB): VIDEO BRONCHOSCOPY (N/A) VIDEO ASSISTED THORACOSCOPY (VATS)/RIGHT MIDDLE AND LOWER  BILOBECTOMY  (Right)   Subjective:  Patient with worsening pain along right shoulder yesterday afternoon.  Brother provides translation  Objective: Vital signs in last 24 hours: Temp:  [97.3 F (36.3 C)-98.5 F (36.9 C)] 97.6 F (36.4 C) (06/05 0755) Pulse Rate:  [75-80] 75 (06/05 0755) Cardiac Rhythm:  [-] Normal sinus rhythm (06/05 0245) Resp:  [11-28] 12 (06/05 0755) BP: (93-109)/(63-74) 93/63 mmHg (06/05 0755) SpO2:  [99 %-100 %] 99 % (06/05 0755)  Intake/Output from previous day: 06/04 0701 - 06/05 0700 In: 480 [P.O.:240; I.V.:240] Out: 1240 [Urine:950; Chest Tube:290]  General appearance: alert, cooperative and no distress Heart: regular rate and rhythm Lungs: clear to auscultation bilaterally Abdomen: soft, non-tender; bowel sounds normal; no masses,  no organomegaly Wound: clean and dry  Lab Results: No results for input(s): WBC, HGB, HCT, PLT in the last 72 hours. BMET: No results for input(s): NA, K, CL, CO2, GLUCOSE, BUN, CREATININE, CALCIUM in the last 72 hours.  PT/INR: No results for input(s): LABPROT, INR in the last 72 hours. ABG    Component Value Date/Time   PHART 7.385 03/19/2016 0650   HCO3 23.3 03/19/2016 0650   TCO2 24.6 03/19/2016 0650   ACIDBASEDEF 1.0 03/19/2016 0650   O2SAT 99.4 03/19/2016 0650   CBG (last 3)   Recent Labs  03/20/16 1103  GLUCAP 109*    Assessment/Plan: S/P Procedure(s) (LRB): VIDEO BRONCHOSCOPY (N/A) VIDEO ASSISTED THORACOSCOPY (VATS)/RIGHT MIDDLE AND LOWER  BILOBECTOMY  (Right)  1. Chest tube- 1+ air leak, placed on suction yesterday due to increased pneumothorax on CXR 2. Pulm- CXR shows near resolution of pneumothorax, continue IS 3. Pain- increased pain yesterday likely due to lung re-expansion,  will continue current pain regimen for now 4. Dispo- patient stable, improvement of pneumothorax, leave chest tube on suction today with air leak present   LOS: 5 days    BARRETT, ERIN 03/23/2016  Patient seen and examined. CXR shows right lung expanded with minimal space- will kepp on suction today Air leak improved but still present-  Path still pending  Remo Lipps C. Roxan Hockey, MD [facility] [phone]

## 2016-03-24 ENCOUNTER — Inpatient Hospital Stay (HOSPITAL_COMMUNITY): Payer: BLUE CROSS/BLUE SHIELD

## 2016-03-24 MED ORDER — ENSURE ENLIVE PO LIQD
237.0000 mL | Freq: Three times a day (TID) | ORAL | Status: DC
  Administered 2016-03-24 – 2016-04-04 (×21): 237 mL via ORAL

## 2016-03-24 NOTE — Progress Notes (Signed)
6 Days Post-Op Procedure(s) (LRB): VIDEO BRONCHOSCOPY (N/A) VIDEO ASSISTED THORACOSCOPY (VATS)/RIGHT MIDDLE AND LOWER  BILOBECTOMY  (Right) Subjective: C/o pain right scapula, no pain at incision   Objective: Vital signs in last 24 hours: Temp:  [97.6 F (36.4 C)-98.6 F (37 C)] 98.6 F (37 C) (06/06 0301) Pulse Rate:  [68-81] 81 (06/06 0742) Cardiac Rhythm:  [-] Normal sinus rhythm (06/06 0743) Resp:  [13-27] 20 (06/06 0742) BP: (97-108)/(63-81) 97/74 mmHg (06/06 0742) SpO2:  [97 %-100 %] 100 % (06/06 0742)  Hemodynamic parameters for last 24 hours:    Intake/Output from previous day: 06/05 0701 - 06/06 0700 In: 300 [P.O.:240; I.V.:60] Out: 1000 [Urine:600; Chest Tube:400] Intake/Output this shift: Total I/O In: -  Out: 120 [Chest Tube:120]  General appearance: alert, cooperative and no distress Neurologic: intact Heart: regular rate and rhythm Lungs: improved BS on right Wound: clean and dry small air leak (decreased)  Lab Results: No results for input(s): WBC, HGB, HCT, PLT in the last 72 hours. BMET: No results for input(s): NA, K, CL, CO2, GLUCOSE, BUN, CREATININE, CALCIUM in the last 72 hours.  PT/INR: No results for input(s): LABPROT, INR in the last 72 hours. ABG    Component Value Date/Time   PHART 7.385 03/19/2016 0650   HCO3 23.3 03/19/2016 0650   TCO2 24.6 03/19/2016 0650   ACIDBASEDEF 1.0 03/19/2016 0650   O2SAT 99.4 03/19/2016 0650   CBG (last 3)  No results for input(s): GLUCAP in the last 72 hours.  Assessment/Plan: S/P Procedure(s) (LRB): VIDEO BRONCHOSCOPY (N/A) VIDEO ASSISTED THORACOSCOPY (VATS)/RIGHT MIDDLE AND LOWER  BILOBECTOMY  (Right) -  Air leak decreased- will try on water seal today, still has moderate serous drainage  Continue PCA  Is ambulating, SCD + enoxaparin for DVT prophylaxis  Path still pending   LOS: 6 days    Cathy Little 03/24/2016

## 2016-03-24 NOTE — Progress Notes (Signed)
Initial Nutrition Assessment  DOCUMENTATION CODES:   Severe malnutrition in context of chronic illness, Underweight  INTERVENTION:  Ensure Enlive TID. Each supplement provides 350 kcals and 20 grams of protein.   NUTRITION DIAGNOSIS:   Malnutrition related to chronic illness as evidenced by severe depletion of muscle mass, severe depletion of body fat.  GOAL:   Patient will meet greater than or equal to 90% of their needs  MONITOR:   PO intake, Supplement acceptance, Labs, Weight trends, Skin, I & O's  REASON FOR ASSESSMENT:   Other (Comment) (low BMI)    ASSESSMENT:   Pt recently moved to [place] from [place]. She has a history of ovarian cysts. Chief complaint of chest pain. She says she experiences chest pain when she is sad or upset and sometimes after eating. It is not exertional. She also complains of some pain along the bottom of her left rib cage. And also sometimes has pain in her right side of her back. Carcinoid tumor, right lower lobe.  S/p Bronchoscopy, right video-assisted thoracoscopy, thoracoscopic right middle and lower bilobectomy, lymph node dissection, On-Q local anesthetic catheter placement.  Pt speaks minimal english and son (who is translating) was not present.  Pt indicates no weight loss, good appetite, and eating TID PTA.  Breakfast tray was at bedside and pt states she will eat it at 11 am. PO's per chart 50-75%.  Pt indicated that she would be amenable to Ensure. Will provide TID to supplement diet d/t malnourished state.   No weight loss per pt and chart.  NFPE: severe muscle and fat depletion, no edema. Pt is severely malnourished.  Labs reviewed; CBgs 82-109, BUN <5, Ca 8.7.  Meds reviewed; KCl, senekot, dulcolax   Diet Order:  Diet regular Room service appropriate?: Yes; Fluid consistency:: Thin  Skin:  Reviewed, no issues  Last BM:  6/5  Height:   Ht Readings from Last 1 Encounters:  03/18/16 '5\' 6"'$  (1.676 m)    Weight:    Wt Readings from Last 1 Encounters:  03/18/16 110 lb 3 oz (49.981 kg)    Ideal Body Weight:  59.1 kg  BMI:  Body mass index is 17.79 kg/(m^2).  Estimated Nutritional Needs:   Kcal:  1500-1700  Protein:  70-80 g  Fluid:  1.5 L  EDUCATION NEEDS:   No education needs identified at this time  Geoffery Lyons, [place] Dietetic Intern Pager [phone]

## 2016-03-25 ENCOUNTER — Inpatient Hospital Stay (HOSPITAL_COMMUNITY): Payer: BLUE CROSS/BLUE SHIELD

## 2016-03-25 MED ORDER — WHITE PETROLATUM GEL
Status: AC
  Administered 2016-03-25: 0.2
  Filled 2016-03-25: qty 1

## 2016-03-25 NOTE — Progress Notes (Addendum)
[address]       [place] [address]             [phone]      7 Days Post-Op Procedure(s) (LRB): VIDEO BRONCHOSCOPY (N/A) VIDEO ASSISTED THORACOSCOPY (VATS)/RIGHT MIDDLE AND LOWER  BILOBECTOMY  (Right) Subjective: CXR shows increase in pntx, + air leak  Objective: Vital signs in last 24 hours: Temp:  [97.5 F (36.4 C)-98.4 F (36.9 C)] 98.2 F (36.8 C) (06/07 0339) Pulse Rate:  [72-79] 78 (06/07 0339) Cardiac Rhythm:  [-] Normal sinus rhythm (06/06 1927) Resp:  [12-22] 12 (06/07 0343) BP: (98-109)/(63-77) 102/75 mmHg (06/07 0339) SpO2:  [98 %-100 %] 98 % (06/07 0343)  Hemodynamic parameters for last 24 hours:    Intake/Output from previous day: 06/06 0701 - 06/07 0700 In: 564 [P.O.:534; I.V.:30] Out: 345 [Urine:150; Chest Tube:195] Intake/Output this shift:    General appearance: alert, cooperative and no distress Heart: regular rate and rhythm Lungs: dim right base Abdomen: benign Extremities: no edema Wound: incis heling well  Lab Results: No results for input(s): WBC, HGB, HCT, PLT in the last 72 hours. BMET: No results for input(s): NA, K, CL, CO2, GLUCOSE, BUN, CREATININE, CALCIUM in the last 72 hours.  PT/INR: No results for input(s): LABPROT, INR in the last 72 hours. ABG    Component Value Date/Time   PHART 7.385 03/19/2016 0650   HCO3 23.3 03/19/2016 0650   TCO2 24.6 03/19/2016 0650   ACIDBASEDEF 1.0 03/19/2016 0650   O2SAT 99.4 03/19/2016 0650   CBG (last 3)  No results for input(s): GLUCAP in the last 72 hours.  Meds Scheduled Meds: . bisacodyl  10 mg Oral Daily  . enoxaparin (LOVENOX) injection  40 mg Subcutaneous Q24H  . feeding supplement (ENSURE ENLIVE)  237 mL Oral TID BM  . fentaNYL   Intravenous Q4H  . pantoprazole  40 mg Oral Daily  . senna-docusate  1 tablet Oral QHS   Continuous Infusions: . dextrose 5 % and 0.9 % NaCl with KCl 20 mEq/L 10 mL/hr at 03/23/16 0600   PRN Meds:.diphenhydrAMINE **OR**  diphenhydrAMINE, naloxone **AND** sodium chloride flush, ondansetron (ZOFRAN) IV, oxyCODONE, potassium chloride, traMADol  Xrays Dg Chest Port 1 View  03/25/2016  CLINICAL DATA:  History of right middle and right lower lobectomy for resection of carcinoid tumor, followup EXAM: PORTABLE CHEST 1 VIEW COMPARISON:  Portable chest x-ray of 03/24/2016 FINDINGS: Right chest tube remains and there has been and increase in volume of right pneumothorax now approximately 50%. The left lung is clear. There may be a small amount of right fluid present. Right IJ central venous line tip overlies the mid SVC. Heart size is stable. IMPRESSION: Increasing right pneumothorax now approximately 50% with probable small right effusion. Electronically Signed   By: Ivar Drape M.D.   On: 03/25/2016 08:09   Dg Chest Port 1 View  03/24/2016  CLINICAL DATA:  History of pneumothorax, followup EXAM: PORTABLE CHEST 1 VIEW COMPARISON:  Portable chest x-ray of 03/23/2016 and 03/22/2016 FINDINGS: There is still small right pneumothorax present with right chest tube noted. Right central venous line is unchanged in position. The left lung is clear. Slight elevation of the right hemidiaphragm is again noted. IMPRESSION: Little change in small residual right pneumothorax with right chest tube remaining. Electronically Signed   By: Ivar Drape M.D.   On: 03/24/2016 08:21    Assessment/Plan: S/P Procedure(s) (LRB): VIDEO BRONCHOSCOPY (N/A) VIDEO ASSISTED THORACOSCOPY (VATS)/RIGHT MIDDLE AND  LOWER  BILOBECTOMY  (Right)  1 clinically conts to progress well, will place to 10 cc H2O suction, moderate drainage- repeat CXR in am 2 final path pending  3 recheck labs in am 4 routine rehab/pulm toilet   LOS: 7 days    GOLD,WAYNE E 03/25/2016  Patient seen and examined, agree with above Back on suction as lung dropped on water seal- air leak is smaller than it was but need to keep tube for now Drainage decreasing Will check on path  today  Remo Lipps C. 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 Roxan Hockey, MD [facility] [phone]

## 2016-03-25 NOTE — Progress Notes (Signed)
Patient has air leak in chest tube. Radiology called with increase in pneumothorax in xray. Jadene Pierini, [place] at bedside and aware. Ordered to place chest tube on 10cm of suction. Put chest tube back to suction. Patients vital signs are WNL. Will continue to monitor.

## 2016-03-26 ENCOUNTER — Inpatient Hospital Stay (HOSPITAL_COMMUNITY): Payer: BLUE CROSS/BLUE SHIELD

## 2016-03-26 LAB — BASIC METABOLIC PANEL
ANION GAP: 7 (ref 5–15)
BUN: 13 mg/dL (ref 6–20)
CALCIUM: 9.4 mg/dL (ref 8.9–10.3)
CO2: 30 mmol/L (ref 22–32)
CREATININE: 0.46 mg/dL (ref 0.44–1.00)
Chloride: 100 mmol/L — ABNORMAL LOW (ref 101–111)
GLUCOSE: 103 mg/dL — AB (ref 65–99)
Potassium: 3.6 mmol/L (ref 3.5–5.1)
Sodium: 137 mmol/L (ref 135–145)

## 2016-03-26 LAB — CBC
HCT: 35.6 % — ABNORMAL LOW (ref 36.0–46.0)
Hemoglobin: 11.9 g/dL — ABNORMAL LOW (ref 12.0–15.0)
MCH: 30.9 pg (ref 26.0–34.0)
MCHC: 33.4 g/dL (ref 30.0–36.0)
MCV: 92.5 fL (ref 78.0–100.0)
PLATELETS: 283 10*3/uL (ref 150–400)
RBC: 3.85 MIL/uL — ABNORMAL LOW (ref 3.87–5.11)
RDW: 13 % (ref 11.5–15.5)
WBC: 5.5 10*3/uL (ref 4.0–10.5)

## 2016-03-26 NOTE — Progress Notes (Addendum)
      [address]       [place] [address]             [phone]       8 Days Post-Op Procedure(s) (LRB): VIDEO BRONCHOSCOPY (N/A) VIDEO ASSISTED THORACOSCOPY (VATS)/RIGHT MIDDLE AND LOWER  BILOBECTOMY  (Right)  Subjective: Patient without complaints this am  Objective: Vital signs in last 24 hours: Temp:  [97.6 F (36.4 C)-98.2 F (36.8 C)] 98 F (36.7 C) (06/08 0502) Pulse Rate:  [88-106] 88 (06/08 0757) Cardiac Rhythm:  [-] Normal sinus rhythm (06/08 0757) Resp:  [17-29] 17 (06/08 0757) BP: (93-105)/(62-71) 102/71 mmHg (06/08 0757) SpO2:  [96 %-100 %] 99 % (06/08 0757)     Intake/Output from previous day: 06/07 0701 - 06/08 0700 In: 520.2 [P.O.:290; I.V.:230.2] Out: 180 [Chest Tube:180]   Physical Exam:  Cardiovascular: Tachycardic Pulmonary: Slightly diminished on right  Abdomen: Soft, non tender, bowel sounds present. Extremities: No LE edema Wounds: Clean and dry.  No erythema or signs of infection. Chest Tube: to suction, no air leak  Lab Results: CBC:  Recent Labs  03/26/16 0455  WBC 5.5  HGB 11.9*  HCT 35.6*  PLT 283   BMET:   Recent Labs  03/26/16 0455  NA 137  K 3.6  CL 100*  CO2 30  GLUCOSE 103*  BUN 13  CREATININE 0.46  CALCIUM 9.4    PT/INR: No results for input(s): LABPROT, INR in the last 72 hours. ABG:  INR: Will add last result for INR, ABG once components are confirmed Will add last 4 CBG results once components are confirmed  Assessment/Plan:  1. CV - ST in the low 100's. 2.  Pulmonary - Chest tube output with 180 cc of output last 24 hours. Chest tube is to 10 cm suction. There is no air leak. CXR this am shows right pneumothorax, perhaps less so than on previous CXR. Encourage incentive spirometer 3. Anemia-H and H stable at 11.9 and 35.6   ZIMMERMAN,DONIELLE MPA-C 03/26/2016,8:02 AM   Patient seen and examined, agree with above She has a space on CXR and a small air leak with cough- will  place to water seal today She is up walking  [place] C. Roxan Hockey, MD [facility] [phone]

## 2016-03-27 ENCOUNTER — Inpatient Hospital Stay (HOSPITAL_COMMUNITY): Payer: BLUE CROSS/BLUE SHIELD

## 2016-03-27 MED ORDER — ACETAMINOPHEN 500 MG PO TABS
1000.0000 mg | ORAL_TABLET | Freq: Four times a day (QID) | ORAL | Status: DC | PRN
Start: 2016-03-27 — End: 2016-04-05
  Administered 2016-03-27: 1000 mg via ORAL
  Filled 2016-03-27: qty 2

## 2016-03-27 NOTE — Progress Notes (Signed)
9 Days Post-Op Procedure(s) (LRB): VIDEO BRONCHOSCOPY (N/A) VIDEO ASSISTED THORACOSCOPY (VATS)/RIGHT MIDDLE AND LOWER  BILOBECTOMY  (Right) Subjective: Some incisional pain, also c/o headache  Objective: Vital signs in last 24 hours: Temp:  [97.6 F (36.4 C)-98.6 F (37 C)] 98.3 F (36.8 C) (06/09 0346) Pulse Rate:  [29-97] 29 (06/09 0341) Cardiac Rhythm:  [-] Normal sinus rhythm (06/09 0341) Resp:  [17-32] 17 (06/09 0341) BP: (99-118)/(68-71) 118/68 mmHg (06/09 0341) SpO2:  [88 %-100 %] 88 % (06/09 0341)  Hemodynamic parameters for last 24 hours:    Intake/Output from previous day: 06/08 0701 - 06/09 0700 In: 480 [P.O.:480] Out: 71 [Stool:1; Chest Tube:70] Intake/Output this shift:    General appearance: alert, cooperative and no distress Neurologic: intact Heart: regular rate and rhythm Lungs: rhonchi anterior - on right Wound: clean and dry + air leak- decreases with repetitive cough  Lab Results:  Recent Labs  03/26/16 0455  WBC 5.5  HGB 11.9*  HCT 35.6*  PLT 283   BMET:  Recent Labs  03/26/16 0455  NA 137  K 3.6  CL 100*  CO2 30  GLUCOSE 103*  BUN 13  CREATININE 0.46  CALCIUM 9.4    PT/INR: No results for input(s): LABPROT, INR in the last 72 hours. ABG    Component Value Date/Time   PHART 7.385 03/19/2016 0650   HCO3 23.3 03/19/2016 0650   TCO2 24.6 03/19/2016 0650   ACIDBASEDEF 1.0 03/19/2016 0650   O2SAT 99.4 03/19/2016 0650   CBG (last 3)  No results for input(s): GLUCAP in the last 72 hours.  Assessment/Plan: S/P Procedure(s) (LRB): VIDEO BRONCHOSCOPY (N/A) VIDEO ASSISTED THORACOSCOPY (VATS)/RIGHT MIDDLE AND LOWER  BILOBECTOMY  (Right) Persistent air leak- keep tube on water seal for now  Continue ambulation   LOS: 9 days    [place] Nakayama 03/27/2016

## 2016-03-28 ENCOUNTER — Inpatient Hospital Stay (HOSPITAL_COMMUNITY): Payer: BLUE CROSS/BLUE SHIELD

## 2016-03-28 NOTE — Progress Notes (Addendum)
[address]       [place] [address]             [phone]      10 Days Post-Op Procedure(s) (LRB): VIDEO BRONCHOSCOPY (N/A) VIDEO ASSISTED THORACOSCOPY (VATS)/RIGHT MIDDLE AND LOWER  BILOBECTOMY  (Right) Subjective: conts with moderate air leak  Objective: Vital signs in last 24 hours: Temp:  [97.5 F (36.4 C)-98.5 F (36.9 C)] 98.5 F (36.9 C) (06/10 0700) Pulse Rate:  [79-105] 105 (06/10 0800) Cardiac Rhythm:  [-] Sinus tachycardia (06/10 0800) Resp:  [21-29] 28 (06/10 0800) BP: (91-103)/(66-75) 92/75 mmHg (06/10 0800) SpO2:  [95 %-100 %] 95 % (06/10 0800)  Hemodynamic parameters for last 24 hours:    Intake/Output from previous day: 06/09 0701 - 06/10 0700 In: 960 [P.O.:960] Out: 60 [Chest Tube:60] Intake/Output this shift: Total I/O In: 240 [P.O.:240] Out: 0   General appearance: alert, cooperative and no distress Heart: regular rate and rhythm Lungs: clear to auscultation bilaterally Abdomen: benign Extremities: no edema Wound: incis healing well  Lab Results:  Recent Labs  03/26/16 0455  WBC 5.5  HGB 11.9*  HCT 35.6*  PLT 283   BMET:  Recent Labs  03/26/16 0455  NA 137  K 3.6  CL 100*  CO2 30  GLUCOSE 103*  BUN 13  CREATININE 0.46  CALCIUM 9.4    PT/INR: No results for input(s): LABPROT, INR in the last 72 hours. ABG    Component Value Date/Time   PHART 7.385 03/19/2016 0650   HCO3 23.3 03/19/2016 0650   TCO2 24.6 03/19/2016 0650   ACIDBASEDEF 1.0 03/19/2016 0650   O2SAT 99.4 03/19/2016 0650   CBG (last 3)  No results for input(s): GLUCAP in the last 72 hours.  Meds Scheduled Meds: . bisacodyl  10 mg Oral Daily  . enoxaparin (LOVENOX) injection  40 mg Subcutaneous Q24H  . feeding supplement (ENSURE ENLIVE)  237 mL Oral TID BM  . pantoprazole  40 mg Oral Daily  . senna-docusate  1 tablet Oral QHS   Continuous Infusions:  PRN Meds:.acetaminophen, diphenhydrAMINE **OR** diphenhydrAMINE, naloxone  **AND** sodium chloride flush, ondansetron (ZOFRAN) IV, oxyCODONE, potassium chloride, traMADol  Xrays Dg Chest Port 1 View  03/28/2016  CLINICAL DATA:  Status post lobectomy on the right. EXAM: PORTABLE CHEST 1 VIEW COMPARISON:  March 27, 2016 chest x-ray FINDINGS: A larger right pneumothorax is similar in the interval with no interval increase in size. The right chest tube has been adjusted with tip projecting over the right apex. No pneumothorax on the left. No other acute abnormalities. IMPRESSION: Stable right pneumothorax.  A right chest tube remains in place. Electronically Signed   By: Dorise Bullion III M.D   On: 03/28/2016 09:03   Dg Chest Port 1 View  03/27/2016  CLINICAL DATA:  Follow-up right-sided pneumothorax status post right lower lobectomy on Mar 18, 2016 EXAM: PORTABLE CHEST 1 VIEW COMPARISON:  Portable chest x-ray of March 26, 2016. FINDINGS: The right-sided pneumothorax is stable to slightly more come pick you as. There is a basilar component of the pneumothorax as well which is larger. There is no mediastinal shift. The chest tube tip projects over the medial aspect of the right second rib. The left lung is clear. The mediastinum is not shifted. The heart and pulmonary vascularity are normal. IMPRESSION: Mildly increased right sided pneumothorax amounting to approximately 30% of the lung volume. Pleural space air is visible atypically the, laterally, and inferior-medially. The  examination is otherwise stable. Electronically Signed   By: David  Martinique M.D.   On: 03/27/2016 07:57    Assessment/Plan: S/P Procedure(s) (LRB): VIDEO BRONCHOSCOPY (N/A) VIDEO ASSISTED THORACOSCOPY (VATS)/RIGHT MIDDLE AND LOWER  BILOBECTOMY  (Right)  1 stable with pntx and air leak, may need to consider bronchial valves if doesn't resolve- cont H2) seal  LOS: 10 days    [name],[name] 03/28/2016  Patient stable, air leak with cough only. Bedside nurse believes it is improved from yesterday. Continue chest  tube to waterseal.patient examined and medical record reviewed,agree with above note. Tharon Aquas Trigt III 03/28/2016

## 2016-03-29 NOTE — Progress Notes (Addendum)
11 Days Post-Op Procedure(s) (LRB): VIDEO BRONCHOSCOPY (N/A) VIDEO ASSISTED THORACOSCOPY (VATS)/RIGHT MIDDLE AND LOWER  BILOBECTOMY  (Right) Subjective: Feels ok, air leak is unchanged. She is tolerating well  Objective: Vital signs in last 24 hours: Temp:  [97.5 F (36.4 C)-98.3 F (36.8 C)] 98 F (36.7 C) (06/11 0700) Pulse Rate:  [74-92] 90 (06/11 0804) Cardiac Rhythm:  [-] Normal sinus rhythm (06/11 0804) Resp:  [18-29] 18 (06/11 0804) BP: (96-102)/(64-73) 96/73 mmHg (06/11 0804) SpO2:  [95 %-100 %] 96 % (06/11 0804)  Hemodynamic parameters for last 24 hours:    Intake/Output from previous day: 06/10 0701 - 06/11 0700 In: 840 [P.O.:840] Out: 0  Intake/Output this shift: Total I/O In: 240 [P.O.:240] Out: -   General appearance: alert, cooperative and no distress Heart: regular rate and rhythm Lungs: clear to auscultation bilaterally Wound: incis healing well  Lab Results: No results for input(s): WBC, HGB, HCT, PLT in the last 72 hours. BMET: No results for input(s): NA, K, CL, CO2, GLUCOSE, BUN, CREATININE, CALCIUM in the last 72 hours.  PT/INR: No results for input(s): LABPROT, INR in the last 72 hours. ABG    Component Value Date/Time   PHART 7.385 03/19/2016 0650   HCO3 23.3 03/19/2016 0650   TCO2 24.6 03/19/2016 0650   ACIDBASEDEF 1.0 03/19/2016 0650   O2SAT 99.4 03/19/2016 0650   CBG (last 3)  No results for input(s): GLUCAP in the last 72 hours.  Meds Scheduled Meds: . bisacodyl  10 mg Oral Daily  . enoxaparin (LOVENOX) injection  40 mg Subcutaneous Q24H  . feeding supplement (ENSURE ENLIVE)  237 mL Oral TID BM  . pantoprazole  40 mg Oral Daily  . senna-docusate  1 tablet Oral QHS   Continuous Infusions:  PRN Meds:.acetaminophen, diphenhydrAMINE **OR** diphenhydrAMINE, naloxone **AND** sodium chloride flush, ondansetron (ZOFRAN) IV, oxyCODONE, potassium chloride, traMADol  Xrays Dg Chest Port 1 View  03/28/2016  CLINICAL DATA:  Status post  lobectomy on the right. EXAM: PORTABLE CHEST 1 VIEW COMPARISON:  March 27, 2016 chest x-ray FINDINGS: A larger right pneumothorax is similar in the interval with no interval increase in size. The right chest tube has been adjusted with tip projecting over the right apex. No pneumothorax on the left. No other acute abnormalities. IMPRESSION: Stable right pneumothorax.  A right chest tube remains in place. Electronically Signed   By: Dorise Bullion III M.D   On: 03/28/2016 09:03    Assessment/Plan: S/P Procedure(s) (LRB): VIDEO BRONCHOSCOPY (N/A) VIDEO ASSISTED THORACOSCOPY (VATS)/RIGHT MIDDLE AND LOWER  BILOBECTOMY  (Right)  1 status quo regarding air leak- further management to be considered, cont H20 seal   LOS: 11 days    GOLD,WAYNE E 03/29/2016  patient examined and medical record reviewed,agree with above note. Tharon Aquas Trigt III 03/29/2016

## 2016-03-30 ENCOUNTER — Inpatient Hospital Stay (HOSPITAL_COMMUNITY): Payer: BLUE CROSS/BLUE SHIELD

## 2016-03-30 NOTE — Care Management Note (Signed)
Case Management Note  Patient Details  Name: Cathy Little MRN: [id number] Date of Birth: January 10, 1976  Subjective/Objective:     VATS right middle and lower bilobectomy              Action/Plan: Discharge Planning:  NCM spoke to pt at bedside. States she lives at home with [name] # [phone]. He speaks English more fluently. Pt does understand English very well, but slight communication barriers. States she works full-time and is currently since hospitalization on medical leave. NCM Will continue to follow for dc needs.   PCP - Lottie Mussel T MD    Expected Discharge Date:                Expected Discharge Plan:  Home/Self Care  In-House Referral:  NA  Discharge planning Services  CM Consult  Post Acute Care Choice:  NA Choice offered to:  NA  DME Arranged:  N/A DME Agency:  NA  HH Arranged:  NA HH Agency:  NA  Status of Service:  In process, will continue to follow  Medicare Important Message Given:    Date Medicare IM Given:    Medicare IM give by:    Date Additional Medicare IM Given:    Additional Medicare Important Message give by:     If discussed at [place] of Stay Meetings, dates discussed:    Additional Comments:  Erenest Rasher, RN 03/30/2016, 11:42 AM

## 2016-03-30 NOTE — Progress Notes (Addendum)
[address]       [place] [address]             [phone]      12 Days Post-Op Procedure(s) (LRB): VIDEO BRONCHOSCOPY (N/A) VIDEO ASSISTED THORACOSCOPY (VATS)/RIGHT MIDDLE AND LOWER  BILOBECTOMY  (Right) Subjective: Feels pk  Objective: Vital signs in last 24 hours: Temp:  [97.6 F (36.4 C)-98.4 F (36.9 C)] 98 F (36.7 C) (06/12 0704) Pulse Rate:  [78-94] 84 (06/12 0704) Cardiac Rhythm:  [-] Normal sinus rhythm (06/12 0722) Resp:  [15-28] 22 (06/12 0704) BP: (93-101)/(63-72) 97/69 mmHg (06/12 0704) SpO2:  [98 %-100 %] 100 % (06/12 0704)  Hemodynamic parameters for last 24 hours:    Intake/Output from previous day: 06/11 0701 - 06/12 0700 In: 840 [P.O.:840] Out: 30 [Chest Tube:30] Intake/Output this shift: Total I/O In: 240 [P.O.:240] Out: 60 [Chest Tube:60]  General appearance: alert, cooperative and no distress Heart: regular rate and rhythm Lungs: clear to auscultation bilaterally Abdomen: benign Extremities: warm/ perfused Wound: incis healing well  Lab Results: No results for input(s): WBC, HGB, HCT, PLT in the last 72 hours. BMET: No results for input(s): NA, K, CL, CO2, GLUCOSE, BUN, CREATININE, CALCIUM in the last 72 hours.  PT/INR: No results for input(s): LABPROT, INR in the last 72 hours. ABG    Component Value Date/Time   PHART 7.385 03/19/2016 0650   HCO3 23.3 03/19/2016 0650   TCO2 24.6 03/19/2016 0650   ACIDBASEDEF 1.0 03/19/2016 0650   O2SAT 99.4 03/19/2016 0650   CBG (last 3)  No results for input(s): GLUCAP in the last 72 hours.  Meds Scheduled Meds: . bisacodyl  10 mg Oral Daily  . enoxaparin (LOVENOX) injection  40 mg Subcutaneous Q24H  . feeding supplement (ENSURE ENLIVE)  237 mL Oral TID BM  . pantoprazole  40 mg Oral Daily  . senna-docusate  1 tablet Oral QHS   Continuous Infusions:  PRN Meds:.acetaminophen, diphenhydrAMINE **OR** diphenhydrAMINE, naloxone **AND** sodium chloride flush, ondansetron  (ZOFRAN) IV, oxyCODONE, potassium chloride, traMADol  Xrays Dg Chest Port 1 View  03/30/2016  CLINICAL DATA:  Postoperative removal of right lung mass with pneumothorax. EXAM: PORTABLE CHEST 1 VIEW COMPARISON:  March 28, 2016 FINDINGS: Chest tube is unchanged in position on the right. Sizable pneumothorax on the right is unchanged without appreciable tension component. Left lung is clear. Heart size and pulmonary vascularity are within normal limits. No adenopathy. No bone lesions. IMPRESSION: Stable right-sided chest tube with sizable pneumothorax on the right, unchanged. No appreciable tension component. There is no lung edema or consolidation on either side. Stable cardiac silhouette. Electronically Signed   By: Lowella Grip III M.D.   On: 03/30/2016 07:48    Assessment/Plan: S/P Procedure(s) (LRB): VIDEO BRONCHOSCOPY (N/A) VIDEO ASSISTED THORACOSCOPY (VATS)/RIGHT MIDDLE AND LOWER  BILOBECTOMY  (Right)  1 stable- with air leak/pntx- cont current RX. May need bronchial valves   LOS: 12 days    GOLD,WAYNE E 03/30/2016  Patient seen and examined, agree with above Placed back on suction, air leak is very small but os persistent Will leave her on suction Discussed 3 options with Ms. Huard and her brother.  1. Home with chest tube  2. Reoperation  3. Endobronchial valve placement Informed them of relative advantages and disadvantages of each as well potential risks. Of the 3 options I think endobronchial valve placement is the best. Will plan to proceed on Wednesday  Shaylyn Bawa C. Roxan Hockey, MD [facility] [phone]

## 2016-03-30 NOTE — Progress Notes (Signed)
Nutrition Follow-up  DOCUMENTATION CODES:   Severe malnutrition in context of chronic illness, Underweight  INTERVENTION:    Continue Ensure Enlive PO TID, each supplement provides 350 kcal and 20 grams of protein  NUTRITION DIAGNOSIS:   Malnutrition related to chronic illness as evidenced by severe depletion of muscle mass, severe depletion of body fat.  Ongoing  GOAL:   Patient will meet greater than or equal to 90% of their needs  Met  MONITOR:   PO intake, Supplement acceptance, Labs, Weight trends, Skin, I & O's   ASSESSMENT:   Pt recently moved to [place] from [place]. She has a history of ovarian cysts. Chief complaint of chest pain. She says she experiences chest pain when she is sad or upset and sometimes after eating. It is not exertional. She also complains of some pain along the bottom of her left rib cage. And also sometimes has pain in her right side of her back. Carcinoid tumor, right lower lobe.  S/P VATS 6/1.  Plans for endobronchial valve placement on Wednesday. PO intake has improved. Patient likes the vanilla Ensure, has been drinking it TID and will ask for it if the RN does not bring it to her. She is also consuming ~75% of meals. Her brother is bringing in familiar African foods for patient to eat.  Diet Order:  Diet regular Room service appropriate?: Yes; Fluid consistency:: Thin  Skin:  Reviewed, no issues  Last BM:  6/5  Height:   Ht Readings from Last 1 Encounters:  03/18/16 _0  (1.676 m)    Weight:   Wt Readings from Last 1 Encounters:  03/18/16 110 lb 3 oz (49.981 kg)    Ideal Body Weight:  59.1 kg  BMI:  Body mass index is 17.79 kg/(m^2).  Estimated Nutritional Needs:   Kcal:  1500-1700  Protein:  70-80 g  Fluid:  1.5 L  EDUCATION NEEDS:   No education needs identified at this time  Molli Barrows, [place], [place], [place] Pager [phone] After Hours Pager [phone]

## 2016-03-31 ENCOUNTER — Inpatient Hospital Stay (HOSPITAL_COMMUNITY): Payer: BLUE CROSS/BLUE SHIELD

## 2016-03-31 NOTE — Progress Notes (Signed)
13 Days Post-Op Procedure(s) (LRB): VIDEO BRONCHOSCOPY (N/A) VIDEO ASSISTED THORACOSCOPY (VATS)/RIGHT MIDDLE AND LOWER  BILOBECTOMY  (Right) Subjective: Feels well overall, some discomfort at tube site  Objective: Vital signs in last 24 hours: Temp:  [97.4 F (36.3 C)-98.3 F (36.8 C)] 98.3 F (36.8 C) (06/13 0238) Pulse Rate:  [78-87] 86 (06/13 0252) Cardiac Rhythm:  [-] Normal sinus rhythm (06/13 0252) Resp:  [21-28] 21 (06/13 0252) BP: (94-100)/(64-77) 96/67 mmHg (06/13 0252) SpO2:  [98 %-100 %] 100 % (06/13 0252)  Hemodynamic parameters for last 24 hours:    Intake/Output from previous day: 06/12 0701 - 06/13 0700 In: 720 [P.O.:720] Out: 200 [Chest Tube:200] Intake/Output this shift:    General appearance: alert, cooperative and no distress Heart: regular rate and rhythm Lungs: diminished breath sounds right base Wound: clean and dry  Lab Results: No results for input(s): WBC, HGB, HCT, PLT in the last 72 hours. BMET: No results for input(s): NA, K, CL, CO2, GLUCOSE, BUN, CREATININE, CALCIUM in the last 72 hours.  PT/INR: No results for input(s): LABPROT, INR in the last 72 hours. ABG    Component Value Date/Time   PHART 7.385 03/19/2016 0650   HCO3 23.3 03/19/2016 0650   TCO2 24.6 03/19/2016 0650   ACIDBASEDEF 1.0 03/19/2016 0650   O2SAT 99.4 03/19/2016 0650   CBG (last 3)  No results for input(s): GLUCAP in the last 72 hours.  Assessment/Plan: S/P Procedure(s) (LRB): VIDEO BRONCHOSCOPY (N/A) VIDEO ASSISTED THORACOSCOPY (VATS)/RIGHT MIDDLE AND LOWER  BILOBECTOMY  (Right) -  Air leak persists Plan Leave CT to suction today For Bronchoscopy with endobronchial valve placement tomorrow   LOS: 13 days    Melrose Nakayama 03/31/2016

## 2016-04-01 ENCOUNTER — Inpatient Hospital Stay (HOSPITAL_COMMUNITY): Payer: BLUE CROSS/BLUE SHIELD

## 2016-04-01 ENCOUNTER — Inpatient Hospital Stay (HOSPITAL_COMMUNITY): Payer: BLUE CROSS/BLUE SHIELD | Admitting: Anesthesiology

## 2016-04-01 ENCOUNTER — Encounter (HOSPITAL_COMMUNITY): Payer: Self-pay | Admitting: Certified Registered Nurse Anesthetist

## 2016-04-01 ENCOUNTER — Encounter (HOSPITAL_COMMUNITY)
Admission: RE | Disposition: A | Discharge status: Home / Self Care | Payer: Self-pay | Source: Ambulatory Visit | Attending: Thoracic Surgery (Cardiothoracic Vascular Surgery)

## 2016-04-01 DIAGNOSIS — J95812 Postprocedural air leak: Secondary | ICD-10-CM

## 2016-04-01 HISTORY — PX: VIDEO BRONCHOSCOPY WITH INSERTION OF INTERBRONCHIAL VALVE (IBV): SHX6178

## 2016-04-01 SURGERY — BRONCHOSCOPY, FLEXIBLE, WITH INTRABRONCHIAL VALVE INSERTION
Anesthesia: General

## 2016-04-01 MED ORDER — ROCURONIUM BROMIDE 10 MG/ML (PF) SYRINGE
PREFILLED_SYRINGE | INTRAVENOUS | Status: DC | PRN
  Administered 2016-04-01: 40 mg via INTRAVENOUS

## 2016-04-01 MED ORDER — OXYCODONE HCL 5 MG PO TABS: 5.0000 mg | ORAL_TABLET | Freq: Once | ORAL | Status: DC | PRN

## 2016-04-01 MED ORDER — 0.9 % SODIUM CHLORIDE (POUR BTL) OPTIME
TOPICAL | Status: DC | PRN
  Administered 2016-04-01: 1000 mL

## 2016-04-01 MED ORDER — FENTANYL CITRATE (PF) 100 MCG/2ML IJ SOLN
INTRAMUSCULAR | Status: DC | PRN
  Administered 2016-04-01: 50 ug via INTRAVENOUS
  Administered 2016-04-01: 100 ug via INTRAVENOUS
  Administered 2016-04-01: 50 ug via INTRAVENOUS

## 2016-04-01 MED ORDER — FENTANYL CITRATE (PF) 250 MCG/5ML IJ SOLN
INTRAMUSCULAR | Status: AC
  Filled 2016-04-01: qty 5

## 2016-04-01 MED ORDER — LIDOCAINE 2% (20 MG/ML) 5 ML SYRINGE
INTRAMUSCULAR | Status: DC | PRN
  Administered 2016-04-01: 60 mg via INTRAVENOUS

## 2016-04-01 MED ORDER — PROPOFOL 10 MG/ML IV BOLUS
INTRAVENOUS | Status: AC
  Filled 2016-04-01: qty 40

## 2016-04-01 MED ORDER — SUGAMMADEX SODIUM 200 MG/2ML IV SOLN
INTRAVENOUS | Status: DC | PRN
  Administered 2016-04-01: 400 mg via INTRAVENOUS

## 2016-04-01 MED ORDER — METOPROLOL TARTRATE 5 MG/5ML IV SOLN
5.0000 mg | INTRAVENOUS | Status: DC | PRN
  Filled 2016-04-01: qty 5

## 2016-04-01 MED ORDER — HYDROMORPHONE HCL 1 MG/ML IJ SOLN
0.2500 mg | INTRAMUSCULAR | Status: DC | PRN
  Administered 2016-04-01: 0.25 mg via INTRAVENOUS
  Administered 2016-04-01: 0.5 mg via INTRAVENOUS
  Administered 2016-04-01: 0.25 mg via INTRAVENOUS

## 2016-04-01 MED ORDER — PROPOFOL 10 MG/ML IV BOLUS
INTRAVENOUS | Status: DC | PRN
  Administered 2016-04-01: 120 mg via INTRAVENOUS

## 2016-04-01 MED ORDER — DEXTROSE-NACL 5-0.9 % IV SOLN
INTRAVENOUS | Status: DC
  Administered 2016-04-01: 20:00:00 via INTRAVENOUS

## 2016-04-01 MED ORDER — HYDROMORPHONE HCL 1 MG/ML IJ SOLN
INTRAMUSCULAR | Status: AC
  Filled 2016-04-01: qty 1

## 2016-04-01 MED ORDER — METOPROLOL TARTRATE 12.5 MG HALF TABLET: 12.5000 mg | ORAL_TABLET | Freq: Two times a day (BID) | ORAL | Status: AC

## 2016-04-01 MED ORDER — OXYCODONE HCL 5 MG/5ML PO SOLN: 5.0000 mg | Freq: Once | ORAL | Status: DC | PRN

## 2016-04-01 MED ORDER — MIDAZOLAM HCL 2 MG/2ML IJ SOLN
INTRAMUSCULAR | Status: AC
  Filled 2016-04-01: qty 2

## 2016-04-01 MED ORDER — ONDANSETRON HCL 4 MG/2ML IJ SOLN
INTRAMUSCULAR | Status: DC | PRN
  Administered 2016-04-01: 4 mg via INTRAVENOUS

## 2016-04-01 MED ORDER — EPINEPHRINE HCL 1 MG/ML IJ SOLN
INTRAMUSCULAR | Status: DC | PRN
  Administered 2016-04-01: 1 mg

## 2016-04-01 MED ORDER — ONDANSETRON HCL 4 MG/2ML IJ SOLN: 4.0000 mg | Freq: Four times a day (QID) | INTRAMUSCULAR | Status: DC | PRN

## 2016-04-01 MED ORDER — EPINEPHRINE HCL 1 MG/ML IJ SOLN
INTRAMUSCULAR | Status: AC
  Filled 2016-04-01: qty 1

## 2016-04-01 MED ORDER — ROCURONIUM BROMIDE 10 MG/ML (PF) SYRINGE: PREFILLED_SYRINGE | INTRAVENOUS | Status: DC | PRN

## 2016-04-01 MED ORDER — MIDAZOLAM HCL 5 MG/5ML IJ SOLN
INTRAMUSCULAR | Status: DC | PRN
  Administered 2016-04-01: 2 mg via INTRAVENOUS

## 2016-04-01 MED ORDER — DEXAMETHASONE SODIUM PHOSPHATE 10 MG/ML IJ SOLN
INTRAMUSCULAR | Status: DC | PRN
  Administered 2016-04-01: 10 mg via INTRAVENOUS

## 2016-04-01 MED ORDER — LACTATED RINGERS IV SOLN
INTRAVENOUS | Status: DC
  Administered 2016-04-01: 13:00:00 via INTRAVENOUS

## 2016-04-01 SURGICAL SUPPLY — 30 items
CANISTER SUCTION 2500CC (MISCELLANEOUS) ×3 IMPLANT
CATH BALLN 4FR (CATHETERS) ×3 IMPLANT
CATH LOADER DEPLOYMENT HUD (CATHETERS) ×3 IMPLANT
CONT SPEC 4OZ CLIKSEAL STRL BL (MISCELLANEOUS) ×3 IMPLANT
COVER TABLE BACK 60X90 (DRAPES) ×3 IMPLANT
FILTER STRAW FLUID ASPIR (MISCELLANEOUS) IMPLANT
FORCEPS BIOP RJ4 1.8 (CUTTING FORCEPS) IMPLANT
GAUZE SPONGE 4X4 12PLY STRL (GAUZE/BANDAGES/DRESSINGS) ×3 IMPLANT
GLOVE SURG SIGNA 7.5 PF LTX (GLOVE) ×3 IMPLANT
GOWN STRL REUS W/ TWL LRG LVL3 (GOWN DISPOSABLE) ×1 IMPLANT
GOWN STRL REUS W/ TWL XL LVL3 (GOWN DISPOSABLE) ×1 IMPLANT
GOWN STRL REUS W/TWL LRG LVL3 (GOWN DISPOSABLE) ×2
GOWN STRL REUS W/TWL XL LVL3 (GOWN DISPOSABLE) ×2
KIT AIRWAY SIZING HUD (KITS) ×3 IMPLANT
KIT CLEAN ENDO COMPLIANCE (KITS) ×3 IMPLANT
KIT ROOM TURNOVER OR (KITS) ×3 IMPLANT
MARKER SKIN DUAL TIP RULER LAB (MISCELLANEOUS) ×3 IMPLANT
NS IRRIG 1000ML POUR BTL (IV SOLUTION) ×3 IMPLANT
OIL SILICONE PENTAX (PARTS (SERVICE/REPAIRS)) ×3 IMPLANT
PAD ARMBOARD 7.5X6 YLW CONV (MISCELLANEOUS) ×6 IMPLANT
STOPCOCK MORSE 400PSI 3WAY (MISCELLANEOUS) ×3 IMPLANT
SYR 20ML ECCENTRIC (SYRINGE) ×3 IMPLANT
SYRINGE 10CC LL (SYRINGE) ×3 IMPLANT
TOWEL NATURAL 4PK STERILE (DISPOSABLE) ×3 IMPLANT
TRAP SPECIMEN MUCOUS 40CC (MISCELLANEOUS) ×3 IMPLANT
TUBE CONNECTING 20'X1/4 (TUBING) ×1
TUBE CONNECTING 20X1/4 (TUBING) ×2 IMPLANT
UNDERPAD 30X30 INCONTINENT (UNDERPADS AND DIAPERS) ×3 IMPLANT
VALVE IN CARTRIDGE 7MM HUD (Valve) ×6 IMPLANT
VALVE IN CARTRIDGE 9MM HUD (Valve) ×3 IMPLANT

## 2016-04-01 NOTE — Progress Notes (Signed)
Placed on tele to monitor  

## 2016-04-01 NOTE — Anesthesia Preprocedure Evaluation (Addendum)
Anesthesia Evaluation  Patient identified by MRN, date of birth, ID band Patient awake    Reviewed: Allergy & Precautions, NPO status , Patient's Chart, lab work & pertinent test results  Airway Mallampati: II   Neck ROM: full    Dental   Pulmonary  RLL lung mass.  S/p VATS 03/18/16. Persistent air leak.   breath sounds clear to auscultation       Cardiovascular negative cardio ROS   Rhythm:regular Rate:Normal     Neuro/Psych    GI/Hepatic   Endo/Other    Renal/GU      Musculoskeletal   Abdominal   Peds  Hematology   Anesthesia Other Findings   Reproductive/Obstetrics                            Anesthesia Physical Anesthesia Plan  ASA: II  Anesthesia Plan: General   Post-op Pain Management:    Induction: Intravenous  Airway Management Planned: Oral ETT  Additional Equipment:   Intra-op Plan:   Post-operative Plan: Extubation in OR  Informed Consent: I have reviewed the patients History and Physical, chart, labs and discussed the procedure including the risks, benefits and alternatives for the proposed anesthesia with the patient or authorized representative who has indicated his/her understanding and acceptance.     Plan Discussed with: CRNA, Anesthesiologist and Surgeon  Anesthesia Plan Comments:         Anesthesia Quick Evaluation

## 2016-04-01 NOTE — Progress Notes (Signed)
Notified Hodierne of HR 130's-140s on arrival to PACU. BP 108/91. No new orders given at this time. Will continue to monitor.

## 2016-04-01 NOTE — Interval H&P Note (Signed)
History and Physical Interval Note: Post right bilobectomy for carcinoid tumor Has prolonged air leak For EBV placement today  Remo Lipps C. Roxan Hockey, MD [facility] [phone]  04/01/2016 3:24 PM  Cathy Little  has presented today for surgery, with the diagnosis of AIRLEAK  The various methods of treatment have been discussed with the patient and family. After consideration of risks, benefits and other options for treatment, the patient has consented to  Procedure(s): VIDEO BRONCHOSCOPY WITH INSERTION OF INTERBRONCHIAL VALVE (IBV) (N/A) as a surgical intervention .  The patient's history has been reviewed, patient examined, no change in status, stable for surgery.  I have reviewed the patient's chart and labs.  Questions were answered to the patient's satisfaction.     Melrose Nakayama

## 2016-04-01 NOTE — Progress Notes (Signed)
      [address]       [place] [address]             [phone]      C/o right subscapular pain  BP 98/72 mmHg  Pulse 98  Temp(Src) 97.2 F (36.2 C) (Oral)  Resp 28  Ht '5\' 6"'$  (1.676 m)  Wt 110 lb 3 oz (49.981 kg)  BMI 17.79 kg/m2  SpO2 98%  LMP 03/01/2016  Small air leak persists  For endobronchial valve placement today  All questions answered  Remo Lipps C. Roxan Hockey, MD [facility] [phone]

## 2016-04-01 NOTE — Progress Notes (Signed)
Pt walking to bathroom and chg bath given for OR later today

## 2016-04-01 NOTE — Transfer of Care (Signed)
Immediate Anesthesia Transfer of Care Note  Patient: Cathy Little  Procedure(s) Performed: Procedure(s): VIDEO BRONCHOSCOPY WITH INSERTION OF (2) 41m and (1) 913mSPIRATION INTERBRONCHIAL VALVES (IBV) (N/A)  Patient Location: PACU  Anesthesia Type:General  Level of Consciousness: awake, alert  and patient cooperative  Airway & Oxygen Therapy: Patient Spontanous Breathing and Patient connected to nasal cannula oxygen  Post-op Assessment: Report given to RN and Post -op Vital signs reviewed and stable  Post vital signs: Reviewed and stable  Last Vitals:  Filed Vitals:   04/01/16 1140 04/01/16 1641  BP: 92/69 108/91  Pulse: 90 147  Temp: 36.8 C 36.5 C  Resp: 22 13    Last Pain:  Filed Vitals:   04/01/16 1641  PainSc: 2       Patients Stated Pain Goal: 3 ([id number] Complications: No apparent anesthesia complications

## 2016-04-01 NOTE — Brief Op Note (Signed)
03/18/2016 - 04/01/2016  4:37 PM  PATIENT:  Cathy Little  40 y.o. female  PRE-OPERATIVE DIAGNOSIS:  PERSISTENT AIR LEAK  POST-OPERATIVE DIAGNOSIS:  PERSISTENT AIR LEAK  PROCEDURE:  Procedure(s): VIDEO BRONCHOSCOPY WITH INSERTION OF (2) 24m and (1) 939mSPIRATION INTERBRONCHIAL VALVES (IBV) (N/A)  SURGEON:  Surgeon(s) and Role:    * StMelrose NakayamaMD - Primary  PHYSICIAN ASSISTANT:   ASSISTANTS: none   ANESTHESIA:   general  EBL:     BLOOD ADMINISTERED:none  DRAINS: none   LOCAL MEDICATIONS USED:  NONE  SPECIMEN:  No Specimen  DISPOSITION OF SPECIMEN:  N/A  PLAN OF CARE: Admit to inpatient   PATIENT DISPOSITION:  PACU - hemodynamically stable.   Delay start of Pharmacological VTE agent (>24hrs) due to surgical blood loss or risk of bleeding: no

## 2016-04-01 NOTE — Op Note (Signed)
Cathy Little, Cathy Little NO.:  [id number]  MEDICAL RECORD NO.:  [id number]  LOCATION:  3S13C                        FACILITY:  [facility]  PHYSICIAN:  Revonda Standard. Roxan Hockey, M.D.DATE OF BIRTH:  June 07, 1976  DATE OF PROCEDURE:  04/01/2016 DATE OF DISCHARGE:                              OPERATIVE REPORT   PREOPERATIVE DIAGNOSIS:  Persistent air leak after bilobectomy.  POSTOPERATIVE DIAGNOSIS:  Persistent air leak after bilobectomy.  PROCEDURE:  Video bronchoscopy with insertion of Spiration intrabronchial valves in right upper lobe segmental bronchi.  SURGEON:  Revonda Standard. Roxan Hockey, M.D.  ASSISTANT:  None.  ANESTHESIA:  General.  FINDINGS:  Bronchial stump healing well.  Unable to isolate air leak to a specific segmental bronchus.  Good positioning of valves.  CLINICAL NOTE:  Cathy Little is a 40 year old woman, who recently underwent a right bilobectomy for a carcinoid tumor.  She had a persistent air leak postoperatively.  She was advised to have intrabronchial valves placed for treatment of the persistent air leak.  The indications, risks, benefits, and alternatives were discussed in detail with the patient.  She understood and accepted the risks and agreed to proceed.  OPERATIVE NOTE:  Cathy Little was brought to the operating room on April 01, 2016.  She had induction of general anesthesia.  There was a very small air leak.  Flexible fiberoptic bronchoscopy was performed via the endotracheal tube.  There were no endobronchial lesions seen.  The bronchus intermedius stump appeared to be healing well.  Attempts to isolate the leak to a single segmental bronchus of the right upper lobe were unsuccessful.  The origins of the anterior, apical and posterior segmental bronchi were measured with the calibrated balloon.  A 9 mm valve was placed into the posterior segmental bronchus, and 7 mm valves placed into the anterior and apical segmental bronchi.  There was complete  resolution of the minimal air leak after placement of the endobronchial valves.  The valves were well positioned.  The bronchoscope was removed.  The patient was extubated in the operating room and taken to the postanesthetic care unit in good condition.     Revonda Standard Roxan Hockey, M.D.     SCH/MEDQ  D:  04/01/2016  T:  04/01/2016  Job:  [id number]

## 2016-04-01 NOTE — Anesthesia Procedure Notes (Signed)
Procedure Name: Intubation Date/Time: 04/01/2016 4:03 PM Performed by: Salli Quarry Nithya Meriweather Pre-anesthesia Checklist: Patient identified, Emergency Drugs available, Suction available and Patient being monitored Patient Re-evaluated:Patient Re-evaluated prior to inductionOxygen Delivery Method: Circle System Utilized Preoxygenation: Pre-oxygenation with 100% oxygen Intubation Type: IV induction Ventilation: Mask ventilation without difficulty Laryngoscope Size: Mac and 3 Grade View: Grade II Tube type: Oral Tube size: 8.5 mm Number of attempts: 1 Airway Equipment and Method: Stylet Placement Confirmation: ETT inserted through vocal cords under direct vision,  positive ETCO2 and breath sounds checked- equal and bilateral Secured at: 23 cm Tube secured with: Tape Dental Injury: Teeth and Oropharynx as per pre-operative assessment

## 2016-04-02 ENCOUNTER — Other Ambulatory Visit: Payer: Self-pay | Admitting: Thoracic Surgery (Cardiothoracic Vascular Surgery)

## 2016-04-02 ENCOUNTER — Inpatient Hospital Stay (HOSPITAL_COMMUNITY): Payer: BLUE CROSS/BLUE SHIELD

## 2016-04-02 DIAGNOSIS — J984 Other disorders of lung: Secondary | ICD-10-CM

## 2016-04-02 NOTE — Progress Notes (Signed)
Removed chest tube per md order. Followed policies and procedures. Patient tolerated well. Xray called and portable on way to get xray. Patient resting comfortable. Will continue to monitor.

## 2016-04-02 NOTE — Progress Notes (Addendum)
      [address]       [place] [address]             [phone]      1 Day Post-Op Procedure(s) (LRB): VIDEO BRONCHOSCOPY WITH INSERTION OF (2) 48m and (1) 936mSPIRATION INTERBRONCHIAL VALVES (IBV) (N/A)   Subjective:  No complaints.  Hoping tube can come out soon  Objective: Vital signs in last 24 hours: Temp:  [97.2 F (36.2 C)-98.5 F (36.9 C)] 97.2 F (36.2 C) (06/15 0800) Pulse Rate:  [90-147] 93 (06/15 0300) Cardiac Rhythm:  [-] Sinus tachycardia (06/14 2000) Resp:  [0-44] 26 (06/15 0300) BP: (90-118)/(69-91) 107/72 mmHg (06/15 0300) SpO2:  [97 %-100 %] 100 % (06/15 0300) Weight:  [103 lb 9.9 oz (47 kg)] 103 lb 9.9 oz (47 kg) (06/15 0500)  Intake/Output from previous day: 06/14 0701 - 06/15 0700 In: 1227.8 [I.V.:1227.8] Out: 480 [Urine:350; Chest Tube:130]  General appearance: alert, cooperative and no distress Heart: regular rate and rhythm Lungs: clear to auscultation bilaterally Abdomen: soft, non-tender; bowel sounds normal; no masses,  no organomegaly Wound: clean and dry  Lab Results: No results for input(s): WBC, HGB, HCT, PLT in the last 72 hours. BMET: No results for input(s): NA, K, CL, CO2, GLUCOSE, BUN, CREATININE, CALCIUM in the last 72 hours.  PT/INR: No results for input(s): LABPROT, INR in the last 72 hours. ABG    Component Value Date/Time   PHART 7.385 03/19/2016 0650   HCO3 23.3 03/19/2016 0650   TCO2 24.6 03/19/2016 0650   ACIDBASEDEF 1.0 03/19/2016 0650   O2SAT 99.4 03/19/2016 0650   CBG (last 3)  No results for input(s): GLUCAP in the last 72 hours.  Assessment/Plan: S/P Procedure(s) (LRB): VIDEO BRONCHOSCOPY WITH INSERTION OF (2) 57m108mnd (1) 9mm29mIRATION INTERBRONCHIAL VALVES (IBV) (N/A)  1. S/P Placement of IBV... Chest tube is on suction, no air leak present 2. Pulm- no acute issues, continue IS, 3. Dispo- patient stable, air leak resolved on suction, plan per Dr. HendRoxan HockeyOS: 15 days     BARRETT, ERINJunie Panning5/2017  Patient seen and examined, agree with above No air leak- will place to water seal, repeat CXR at noon- if OK and no leak- dc CT  StevRemo LippsHendRoxan Hockey [facility] ([phone]

## 2016-04-02 NOTE — Progress Notes (Signed)
      [address]       [place] [address]             [phone]      No complaints  CXR stable  No air leak  Will dc chest tube  Remo Lipps C. Roxan Hockey, MD [facility] [phone]

## 2016-04-03 ENCOUNTER — Inpatient Hospital Stay (HOSPITAL_COMMUNITY): Payer: BLUE CROSS/BLUE SHIELD

## 2016-04-03 ENCOUNTER — Encounter (HOSPITAL_COMMUNITY): Payer: Self-pay | Admitting: Thoracic Surgery (Cardiothoracic Vascular Surgery)

## 2016-04-03 NOTE — Progress Notes (Signed)
Patient requesting not to walk today.  Spent over half the day in bed, resting/sleeping.  Visibly upset earlier this morning.  Improving emotional outlook as the day went on.

## 2016-04-03 NOTE — Progress Notes (Addendum)
[address]       [place] [address]             [phone]      2 Days Post-Op Procedure(s) (LRB): VIDEO BRONCHOSCOPY WITH INSERTION OF (2) 71m and (1) 9100mSPIRATION INTERBRONCHIAL VALVES (IBV) (N/A) Subjective: Dome SOB with activity, tachycardic  Objective: Vital signs in last 24 hours: Temp:  [97.6 F (36.4 C)-98.5 F (36.9 C)] 98 F (36.7 C) (06/16 0753) Pulse Rate:  [92-123] 104 (06/16 0400) Cardiac Rhythm:  [-] Sinus tachycardia (06/16 0400) Resp:  [21-30] 28 (06/16 0400) BP: (90-104)/(64-76) 97/69 mmHg (06/16 0400) SpO2:  [98 %-100 %] 100 % (06/16 0400)  Hemodynamic parameters for last 24 hours:    Intake/Output from previous day: 06/15 0701 - 06/16 0700 In: 480 [P.O.:480] Out: 0  Intake/Output this shift:    General appearance: alert, cooperative and no distress Heart: regular rate and rhythm and tachy Lungs: clear to auscultation bilaterally Abdomen: benign Wound: incis healing well  Lab Results: No results for input(s): WBC, HGB, HCT, PLT in the last 72 hours. BMET: No results for input(s): NA, K, CL, CO2, GLUCOSE, BUN, CREATININE, CALCIUM in the last 72 hours.  PT/INR: No results for input(s): LABPROT, INR in the last 72 hours. ABG    Component Value Date/Time   PHART 7.385 03/19/2016 0650   HCO3 23.3 03/19/2016 0650   TCO2 24.6 03/19/2016 0650   ACIDBASEDEF 1.0 03/19/2016 0650   O2SAT 99.4 03/19/2016 0650   CBG (last 3)  No results for input(s): GLUCAP in the last 72 hours.  Meds Scheduled Meds: . bisacodyl  10 mg Oral Daily  . enoxaparin (LOVENOX) injection  40 mg Subcutaneous Q24H  . feeding supplement (ENSURE ENLIVE)  237 mL Oral TID BM  . pantoprazole  40 mg Oral Daily  . senna-docusate  1 tablet Oral QHS   Continuous Infusions: . 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 Persistent air leak with Video bronchoscopy with insertion of Spiration intrabronchial valves in right upper lobe segmental bronchi. Initial encounter. EXAM: CHEST  2 VIEW COMPARISON:  04/02/2016 and earlier. FINDINGS: PA and lateral views of the chest. Increased size of the right pneumothorax since yesterday, moderate. Airway occlude or devices re- identified at the right hilum. There may now be a small component of pleural fluid at the right costophrenic angle. Underlying volume loss in the right hemi thorax. Stable cardiac size and mediastinal contours. Visualized tracheal air column is within normal limits. The left lung remains clear. No acute osseous abnormality identified. IMPRESSION: 1. Interval increased right pneumothorax, now moderate. Possible small component of pleural fluid at the right costophrenic angle. 2. Left lung remains clear. Electronically Signed   By: H Genevie Ann.D.   On: 04/03/2016 07:54   Dg Chest 1v Repeat Same Day  04/02/2016  CLINICAL DATA:  Status post chest tube removal on the right EXAM: CHEST - 1 VIEW SAME DAY COMPARISON:  04/02/2016 FINDINGS: There is been interval removal of right-sided chest tube. Cardiac shadow is stable. The left lung remains clear. Endobronchial valves are seen. There is been slight increase in the degree of the right-sided pneumothorax. There is now approximately 2.7 cm of excursion along the apex. No other focal abnormality is seen. IMPRESSION: Slight increase  in right pneumothorax following chest tube removal. Stable endobronchial valves These results will be called to the ordering clinician or representative by the Radiologist Assistant, and communication documented in the PACS or zVision Dashboard. Electronically Signed   By: Inez Catalina M.D.   On: 04/02/2016 16:01   Dg Chest Port 1 View  04/02/2016  CLINICAL DATA:  Status post right lobectomy, pneumothorax EXAM: PORTABLE CHEST 1 VIEW  COMPARISON:  04/01/2016 FINDINGS: Stable right chest tube. Small right apical pneumothorax stable. Postsurgical change over the right perihilar region similar to prior study but with several new coil shaped devices also projecting over the right hilum correlate clinically. Stable tiny right effusion. Left lung is clear. IMPRESSION: Stable small right pneumothorax Electronically Signed   By: Skipper Cliche M.D.   On: 04/02/2016 12:26    Assessment/Plan: S/P Procedure(s) (LRB): VIDEO BRONCHOSCOPY WITH INSERTION OF (2) 21m and (1) 967mSPIRATION INTERBRONCHIAL VALVES (IBV) (N/A)  1 worsening PNTX following tube removal, appears symptomatic, RA sats are excellent however. Surgeon will determine if new tube is required    LOS: 16 days    GOLD,WAYNE E 04/03/2016  Patient seen and examined, agree with above I talked with her via her brother on the telephone She is very upset that she can't go home today and that she might require another CT She has some discomfort but denies any shortness of breath I suspect there is some residual leakage from the right upper lobe and it will equilibrate with time. She is not in distress and her saturation is 100% on RA. I think we can follow this for now, but she is not ready for discharge.  Will repeat a CXR this afternoon and again in AM. Will have CT tray at room in case tube needed urgently which I suspect is highly unlikely.  StRevonda StandardeRoxan HockeyMD [facility] ([phone]

## 2016-04-04 ENCOUNTER — Inpatient Hospital Stay (HOSPITAL_COMMUNITY): Payer: BLUE CROSS/BLUE SHIELD

## 2016-04-04 NOTE — Progress Notes (Addendum)
      [address]       [place], [address]             [phone]      3 Days Post-Op Procedure(s) (LRB): VIDEO BRONCHOSCOPY WITH INSERTION OF (2) 2m and (1) 944mSPIRATION INTERBRONCHIAL VALVES (IBV) (N/A)   Subjective:  No new complaints.  States pain and breathing is better than yesterday.  Frustrated with situation.  Objective: Vital signs in last 24 hours: Temp:  [98.1 F (36.7 C)-98.6 F (37 C)] 98.1 F (36.7 C) (06/17 0742) Pulse Rate:  [91-140] 97 (06/17 0742) Cardiac Rhythm:  [-] Normal sinus rhythm (06/17 0803) Resp:  [17-43] 24 (06/17 0742) BP: (90-104)/(66-80) 101/80 mmHg (06/17 0742) SpO2:  [98 %-100 %] 100 % (06/17 0742)   Intake/Output from previous day: 06/16 0701 - 06/17 0700 In: 60 [P.O.:60] Out: -  Intake/Output this shift: Total I/O In: 240 [P.O.:240] Out: -   General appearance: alert, cooperative and no distress Heart: regular rate and rhythm Lungs: diminished breath sounds on right Abdomen: soft, non-tender; bowel sounds normal; no masses,  no organomegaly Wound: clean and dry  Lab Results: No results for input(s): WBC, HGB, HCT, PLT in the last 72 hours. BMET: No results for input(s): NA, K, CL, CO2, GLUCOSE, BUN, CREATININE, CALCIUM in the last 72 hours.  PT/INR: No results for input(s): LABPROT, INR in the last 72 hours. ABG    Component Value Date/Time   PHART 7.385 03/19/2016 0650   HCO3 23.3 03/19/2016 0650   TCO2 24.6 03/19/2016 0650   ACIDBASEDEF 1.0 03/19/2016 0650   O2SAT 99.4 03/19/2016 0650   CBG (last 3)  No results for input(s): GLUCAP in the last 72 hours.  Assessment/Plan: S/P Procedure(s) (LRB): VIDEO BRONCHOSCOPY WITH INSERTION OF (2) 33m533mnd (1) 9mm33mIRATION INTERBRONCHIAL VALVES (IBV) (N/A)  1. S/P IBV placement- chest tube removed on Thursday, follow up CXR continues to show increase in Pneumothorax... Currently there is no chest tube in place and patient would like to avoid if possible 2.  Pulm- no acute distress, sats 100% on RA, good use of IS 3. CV- Tachy at times, monitor 4. Dispo- patient is medically stable, increase in Pneumothorax on CXR, patients in less pain and less short of breath than yesterday, repeat CXR in AM.   LOS: 17 days    Little, Cathy 04/04/2016   Chart reviewed, patient examined, agree with above. CXR looks fine. There is volume loss in the residual RUL due to the valves with increase in free pleural space, not pneumothorax. There is no subcutaneous air and shift to right as expected. Will repeat CXR in am and if stable will consider sending [facility]

## 2016-04-05 ENCOUNTER — Inpatient Hospital Stay (HOSPITAL_COMMUNITY): Payer: BLUE CROSS/BLUE SHIELD

## 2016-04-05 MED ORDER — ACETAMINOPHEN 500 MG PO TABS: 1000.0000 mg | ORAL_TABLET | Freq: Four times a day (QID) | ORAL | Status: DC | PRN

## 2016-04-05 MED ORDER — OXYCODONE HCL 5 MG PO TABS: 5.0000 mg | ORAL_TABLET | ORAL | Status: DC | PRN

## 2016-04-05 NOTE — Progress Notes (Signed)
Spoke with Ellwood Handler PA to verify that pt can discharge at this time. I removed chest tube sutures per order, pt tolerated well. Reviewed discharge paper work with pt and her brother including follow up appts, medications and sign and symptoms of infection and when to contact MD. They both stated they understood. Pt will discharge in wheelchair. Consuelo Pandy RN

## 2016-04-05 NOTE — Progress Notes (Signed)
Pt's brother returned to hospital after pt's discharge stating that she had left a large bible with money in it in the bed, environmental services was cleaning the room and had not removed anything when he arrived, we searched the bed including all linen, the closet and drawers and trash and could not find the bible. I did not see the bible when caring for the pt on 04/04/16 or 04/05/16. The brother left and called back stating he thinks it got put in the linen bag on Friday 04/03/16 when the sheets were changed. I will call security to check lost and found and Becky, charge RN , states she will Publishing copy. Consuelo Pandy RN

## 2016-04-05 NOTE — Progress Notes (Signed)
      [address]       [place] [address]             [phone]      4 Days Post-Op Procedure(s) (LRB): VIDEO BRONCHOSCOPY WITH INSERTION OF (2) 54m and (1) 938mSPIRATION INTERBRONCHIAL VALVES (IBV) (N/A)   Subjective:  Cathy Little no complaints.  Continues to feel well... Brother at bedside to provide translation  Objective: Vital signs in last 24 hours: Temp:  [97.4 F (36.3 C)-98.5 F (36.9 C)] 97.7 F (36.5 C) (06/18 0753) Pulse Rate:  [84-116] 85 (06/18 0753) Cardiac Rhythm:  [-] Normal sinus rhythm (06/18 0753) Resp:  [14-25] 22 (06/18 0753) BP: (91-98)/(68-77) 93/69 mmHg (06/18 0753) SpO2:  [99 %-100 %] 100 % (06/18 0753)  Intake/Output from previous day: 06/17 0701 - 06/18 0700 In: 960 [P.O.:960] Out: -  Intake/Output this shift: Total I/O In: 240 [P.O.:240] Out: -   General appearance: alert, cooperative and no distress Heart: regular rate and rhythm Lungs: clear on left, minimal air movement on right Abdomen: soft, non-tender; bowel sounds normal; no masses,  no organomegaly Wound: clean and dry  Lab Results: No results for input(s): WBC, HGB, HCT, PLT in the last 72 hours. BMET: No results for input(s): NA, K, CL, CO2, GLUCOSE, BUN, CREATININE, CALCIUM in the last 72 hours.  PT/INR: No results for input(s): LABPROT, INR in the last 72 hours. ABG    Component Value Date/Time   PHART 7.385 03/19/2016 0650   HCO3 23.3 03/19/2016 0650   TCO2 24.6 03/19/2016 0650   ACIDBASEDEF 1.0 03/19/2016 0650   O2SAT 99.4 03/19/2016 0650   CBG (last 3)  No results for input(s): GLUCAP in the last 72 hours.  Assessment/Plan: S/P Procedure(s) (LRB): VIDEO BRONCHOSCOPY WITH INSERTION OF (2) 25m725mnd (1) 9mm84mIRATION INTERBRONCHIAL VALVES (IBV) (N/A)  1. Pulm- patient stable, CXR is not significantly changed from previous film.  Free pleural space, not pneumothorax is present 2. Dispo- will d/c patient home today if okay with Dr. BartCyndia BentOS: 18 days    BARREllwood Handler8/2017

## 2016-04-07 ENCOUNTER — Ambulatory Visit: Payer: BLUE CROSS/BLUE SHIELD | Admitting: Thoracic Surgery (Cardiothoracic Vascular Surgery)

## 2016-04-07 NOTE — Anesthesia Postprocedure Evaluation (Signed)
Anesthesia Post Note  Patient: Cathy Little  Procedure(s) Performed: Procedure(s) (LRB): VIDEO BRONCHOSCOPY WITH INSERTION OF (2) 71m and (1) 943mSPIRATION INTERBRONCHIAL VALVES (IBV) (N/A)  Patient location during evaluation: PACU Anesthesia Type: General Level of consciousness: awake and alert and patient cooperative Pain management: pain level controlled Vital Signs Assessment: post-procedure vital signs reviewed and stable Respiratory status: spontaneous breathing and respiratory function stable Cardiovascular status: stable Anesthetic complications: no    Last Vitals:  Filed Vitals:   04/05/16 0416 04/05/16 0753  BP: 96/69 93/69  Pulse: 87 85  Temp: 36.6 C 36.5 C  Resp: 25 22    Last Pain:  Filed Vitals:   04/05/16 0757  PainSc: 0-No pain                 Ameliah Baskins S

## 2016-04-13 ENCOUNTER — Other Ambulatory Visit: Payer: Self-pay | Admitting: Thoracic Surgery (Cardiothoracic Vascular Surgery)

## 2016-04-13 DIAGNOSIS — R918 Other nonspecific abnormal finding of lung field: Secondary | ICD-10-CM

## 2016-04-14 ENCOUNTER — Encounter: Payer: Self-pay | Admitting: Thoracic Surgery (Cardiothoracic Vascular Surgery)

## 2016-04-14 ENCOUNTER — Ambulatory Visit
Admission: RE | Admit: 2016-04-14 | Discharge: 2016-04-14 | Disposition: A | Discharge status: Home / Self Care | Payer: BLUE CROSS/BLUE SHIELD | Source: Ambulatory Visit | Attending: Thoracic Surgery (Cardiothoracic Vascular Surgery) | Admitting: Thoracic Surgery (Cardiothoracic Vascular Surgery)

## 2016-04-14 ENCOUNTER — Other Ambulatory Visit: Payer: Self-pay | Admitting: *Deleted

## 2016-04-14 ENCOUNTER — Ambulatory Visit (INDEPENDENT_AMBULATORY_CARE_PROVIDER_SITE_OTHER): Payer: Self-pay | Admitting: Thoracic Surgery (Cardiothoracic Vascular Surgery)

## 2016-04-14 VITALS — BP 99/67 | HR 100 | Resp 20 | Ht 66.0 in | Wt 103.0 lb

## 2016-04-14 DIAGNOSIS — D3A00 Benign carcinoid tumor of unspecified site: Secondary | ICD-10-CM

## 2016-04-14 DIAGNOSIS — D3A090 Benign carcinoid tumor of the bronchus and lung: Secondary | ICD-10-CM

## 2016-04-14 DIAGNOSIS — R918 Other nonspecific abnormal finding of lung field: Secondary | ICD-10-CM

## 2016-04-14 DIAGNOSIS — C7A090 Malignant carcinoid tumor of the bronchus and lung: Secondary | ICD-10-CM | POA: Insufficient documentation

## 2016-04-14 DIAGNOSIS — D1431 Benign neoplasm of right bronchus and lung: Secondary | ICD-10-CM

## 2016-04-14 NOTE — Progress Notes (Signed)
Cathy Little       [place],Cathy Little             [phone]       HPI: Cathy Little returns today for scheduled postoperative follow-up visit.  She is a 40 year old woman who recently moved to [place] from [place]. She was found to have a right lung mass. She underwent a right lower and middle bilobectomy on 03/18/2016. The tumor was a carcinoid. Lymph nodes were negative.  She had a prolonged air leak postoperatively and on 04/01/2016 interbronchial valves were placed. Her air leak resolved and her chest tube was removed. She was discharged on 04/05/2016.  Since discharge she's been feeling well. She does have some incisional discomfort primarily along the right costal margin. She is using pain medications sparingly. She has not engaged any heavy exercise.  Past Medical History  Diagnosis Date  . Mass of lower lobe of right lung 03/05/2016    3.2 cm mass RLL, involving bronchus intermedius  . Constipation       Current Outpatient Prescriptions  Medication Sig Dispense Refill  . acetaminophen (TYLENOL) 500 MG tablet Take 2 tablets (1,000 mg total) by mouth every 6 (six) hours as needed for mild pain, fever or headache. 30 tablet 0  . oxyCODONE (OXY IR/ROXICODONE) 5 MG immediate release tablet Take 1-2 tablets (5-10 mg total) by mouth every 4 (four) hours as needed for severe pain. 30 tablet 0  . polyethylene glycol (MIRALAX / GLYCOLAX) packet Take 17 g by mouth daily. Reported on 04/14/2016     No current facility-administered medications for this visit.    Physical Exam BP 99/67 mmHg  Pulse 100  Resp 20  Ht '5\' 6"'$  (1.676 m)  Wt 103 lb (46.72 kg)  BMI 16.63 kg/m2  SpO2 95%  LMP 03/01/2016 (Exact Date) 40 year old woman in no acute distress Alert and oriented 3 with no focal deficits Absent breath sounds right base, clear and upper lung field Incision healing well, some eschar at chest tube site  Diagnostic Tests: CHEST 2 VIEW  COMPARISON:  Radiograph of April 05, 2016.  FINDINGS: The heart size and mediastinal contours are within normal limits. Left lung is clear. Mild right apical pneumothorax is noted which is smaller compared to prior exam. Stable postsurgical changes are seen involving the right lower lobe. No significant pleural effusion is noted. The visualized skeletal structures are unremarkable.  IMPRESSION: Stable postsurgical changes seen in right lower lobe. Mild right apical pneumothorax is noted which is smaller compared to prior exam.   Electronically Signed  By: Marijo Conception, M.D.  On: 04/14/2016 14:11  I personally reviewed the chest x-ray. She has a small residual space/ pneumothorax.  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 Her postoperative course was complicated by prolonged air leak requiring interbronchial valve replacement. She was finally discharged on 04/05/2016.  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 She should not need adjuvant chemotherapy. We will have her see Dr. Julien Nordmann in consultation to get his opinion. I will arrange that appointment.  Prolonged air leak- resolved with interbronchial valve replacement. In her case I want to wait a full 8 weeks before removing the valves. We will schedule valve removal for Monday, August 14. I did discuss the general nature of the procedure. We will do this under general anesthesia. She understands there are no incisions. She understands  there is a remote risk of recurrent pneumothorax after valve removal.  Plan: Referral to [facility] to see Dr. Julien Nordmann  Bronchoscopy with valve removal on Monday, 06/01/2016  Melrose Nakayama, MD [facility] [phone]

## 2016-04-15 ENCOUNTER — Telehealth: Payer: Self-pay | Admitting: Internal Medicine

## 2016-04-15 ENCOUNTER — Encounter: Payer: Self-pay | Admitting: *Deleted

## 2016-04-15 ENCOUNTER — Encounter: Payer: Self-pay | Admitting: Internal Medicine

## 2016-04-15 DIAGNOSIS — D3A090 Benign carcinoid tumor of the bronchus and lung: Secondary | ICD-10-CM

## 2016-04-15 NOTE — Progress Notes (Signed)
Oncology Nurse Navigator Documentation  Oncology Nurse Navigator Flowsheets 04/15/2016  Navigator Location [facility]  Navigator Encounter Type Other  Abnormal Finding Date 02/07/2016  Confirmed Diagnosis Date 03/17/2016  Surgery Date 03/17/2016  Treatment Initiated Date 03/17/2016  Treatment Phase Post-Tx Follow-up  Barriers/Navigation Needs Coordination of Care  Interventions Coordination of Care  Coordination of Care Appts  Acuity Level 2  Acuity Level 2 Assistance expediting appointments  Time Spent with Patient 30   I received referral from Dr. Leonarda Salon office yesterday.  I spoke with Dr. Julien Nordmann about when to schedule.  He stated next new patient appt.  That appt will be 05/04/16.  I updated HIM dept to call and schedule for 05/04/16.

## 2016-04-15 NOTE — Telephone Encounter (Signed)
Pt confirmed appt completed intake. Mailed out letter

## 2016-04-20 ENCOUNTER — Encounter: Payer: Self-pay | Admitting: Obstetrics & Gynecology

## 2016-04-20 ENCOUNTER — Ambulatory Visit (INDEPENDENT_AMBULATORY_CARE_PROVIDER_SITE_OTHER): Payer: BLUE CROSS/BLUE SHIELD | Admitting: Obstetrics & Gynecology

## 2016-04-20 VITALS — BP 99/69 | HR 76 | Wt 109.1 lb

## 2016-04-20 DIAGNOSIS — N898 Other specified noninflammatory disorders of vagina: Secondary | ICD-10-CM

## 2016-04-20 DIAGNOSIS — R52 Pain, unspecified: Secondary | ICD-10-CM | POA: Diagnosis not present

## 2016-04-20 MED ORDER — NAPROXEN 500 MG PO TABS: 500.0000 mg | ORAL_TABLET | Freq: Two times a day (BID) | ORAL | Status: DC | PRN

## 2016-04-20 NOTE — Progress Notes (Signed)
Patient ID: Cathy Little, female   DOB: 1976-07-27, 40 y.o.   MRN: [id number] History:  40 y.o. No obstetric history on file. here today for eval of discharge for 2 months. No pain or burning.  Pt c/o pain in her left side.  In 2015 she was seen in [place] and they dx'd with an ov cyst.   Pt had surgical removal of the ov cyst.  At the same time she had a discharge that ended after tx with a medication.   She also had an exam for uterine cancer while in [place], [place] but, it was negative.  Pan noted with physical activity.  Pain does not radiate.  The following portions of the patient's history were reviewed and updated as appropriate: allergies, current medications, past family history, past medical history, past social history, past surgical history and problem list.  Review of Systems:  Pertinent items are noted in HPI.  Objective:  Physical Exam There were no vitals taken for this visit. Gen: NAD Lungs: CTA CV: RRR Abd: Soft, nontender and nondistended. Well healed transverse incision.  Left side: non tender.  Unable to reproduce pain. Pelvic: Normal appearing external genitalia; normal appearing vaginal mucosa and cervix.  Pt with blood in vault. No discharge appreciated but, could be limited by the fact that pt on her menses. Small uterus, no other palpable masses, no uterine or adnexal tenderness    Assessment & Plan:  Left side pain- suspect MS pain.  Normal exam Discharge  Naproxen bid prn F/u if discharge persists when not on menses  Cathy Little, M.D., Cathy Little

## 2016-04-30 ENCOUNTER — Telehealth: Payer: Self-pay | Admitting: *Deleted

## 2016-04-30 NOTE — Telephone Encounter (Signed)
"  I just received the message that you called.  You can call me at [phone]." Called patient notifying him of appointment Monday 05-04-2016 at 1:45 pm.  Asked if this can be changed to 2:00 pm.  Will notify staff.

## 2016-05-01 ENCOUNTER — Telehealth: Payer: Self-pay | Admitting: Medical Oncology

## 2016-05-01 ENCOUNTER — Telehealth: Payer: Self-pay | Admitting: Internal Medicine

## 2016-05-01 NOTE — Telephone Encounter (Signed)
pt cld to r/s appt-gave pt sch time & date for 7/17

## 2016-05-01 NOTE — Telephone Encounter (Signed)
I left message with pt appts.

## 2016-05-04 ENCOUNTER — Other Ambulatory Visit: Payer: BLUE CROSS/BLUE SHIELD

## 2016-05-04 ENCOUNTER — Encounter: Payer: Self-pay | Admitting: Internal Medicine

## 2016-05-04 ENCOUNTER — Ambulatory Visit (HOSPITAL_BASED_OUTPATIENT_CLINIC_OR_DEPARTMENT_OTHER): Payer: BLUE CROSS/BLUE SHIELD | Admitting: Internal Medicine

## 2016-05-04 ENCOUNTER — Other Ambulatory Visit (HOSPITAL_BASED_OUTPATIENT_CLINIC_OR_DEPARTMENT_OTHER): Payer: BLUE CROSS/BLUE SHIELD

## 2016-05-04 ENCOUNTER — Telehealth: Payer: Self-pay | Admitting: Internal Medicine

## 2016-05-04 VITALS — BP 114/74 | HR 105 | Temp 98.7°F | Resp 18 | Ht 66.0 in | Wt 110.1 lb

## 2016-05-04 DIAGNOSIS — C7A090 Malignant carcinoid tumor of the bronchus and lung: Secondary | ICD-10-CM | POA: Diagnosis not present

## 2016-05-04 DIAGNOSIS — D3A090 Benign carcinoid tumor of the bronchus and lung: Secondary | ICD-10-CM

## 2016-05-04 LAB — CBC WITH DIFFERENTIAL/PLATELET
BASO%: 0.4 % (ref 0.0–2.0)
Basophils Absolute: 0 10*3/uL (ref 0.0–0.1)
EOS ABS: 0.1 10*3/uL (ref 0.0–0.5)
EOS%: 2 % (ref 0.0–7.0)
HEMATOCRIT: 39 % (ref 34.8–46.6)
HEMOGLOBIN: 13.5 g/dL (ref 11.6–15.9)
LYMPH#: 2.4 10*3/uL (ref 0.9–3.3)
LYMPH%: 47.9 % (ref 14.0–49.7)
MCH: 31.4 pg (ref 25.1–34.0)
MCHC: 34.6 g/dL (ref 31.5–36.0)
MCV: 90.7 fL (ref 79.5–101.0)
MONO#: 0.5 10*3/uL (ref 0.1–0.9)
MONO%: 9.9 % (ref 0.0–14.0)
NEUT%: 39.8 % (ref 38.4–76.8)
NEUTROS ABS: 2 10*3/uL (ref 1.5–6.5)
PLATELETS: 225 10*3/uL (ref 145–400)
RBC: 4.3 10*6/uL (ref 3.70–5.45)
RDW: 13.2 % (ref 11.2–14.5)
WBC: 5 10*3/uL (ref 3.9–10.3)

## 2016-05-04 LAB — COMPREHENSIVE METABOLIC PANEL
ALBUMIN: 3.9 g/dL (ref 3.5–5.0)
ALK PHOS: 100 U/L (ref 40–150)
ALT: 13 U/L (ref 0–55)
ANION GAP: 9 meq/L (ref 3–11)
AST: 17 U/L (ref 5–34)
BUN: 8.9 mg/dL (ref 7.0–26.0)
CALCIUM: 9.6 mg/dL (ref 8.4–10.4)
CO2: 23 mEq/L (ref 22–29)
CREATININE: 0.7 mg/dL (ref 0.6–1.1)
Chloride: 107 mEq/L (ref 98–109)
Glucose: 93 mg/dl (ref 70–140)
Potassium: 4.1 mEq/L (ref 3.5–5.1)
Sodium: 139 mEq/L (ref 136–145)
TOTAL PROTEIN: 8.1 g/dL (ref 6.4–8.3)

## 2016-05-04 NOTE — Telephone Encounter (Signed)
Gave pt cal & avs °

## 2016-05-04 NOTE — Progress Notes (Signed)
Cathy Little:(336) [phone]   [phone]  CONSULT NOTE  REFERRING PHYSICIAN: Dr. Modesto Little.  REASON FOR CONSULTATION:  40 years old African female recently diagnosed with carcinoid tumor.  HPI Cathy Little is a 40 y.o. female with no significant past medical history except for removal of ovarian cyst and no history of smoking. The patient is originally from [place] and most recently to the [place]. She used to work in a radio station and [place] and she was getting a little bit weaker as she talks. She won a diversity visa lottery to the [place]. During her medical evaluation for the visa, she had chest x-ray performed and it showed an opacity in the right lung. After arrival to the [facility]. The patient was complaining of pain on the right side of the chest and she presented to the emergency department on 02/07/2016 for evaluation. Chest x-ray was performed and it showed right perihilar rounded lesion. This was followed by CT scan of the chest, abdomen and pelvis with contrast on the same day and it showed rounded mass posterior to the right hilum and measured 2.9 x 2.6 cm. This lesion is smoothly marginated. MRI of the chest was performed and showed avidly enhancing lesion in the central aspect of the right lower lobe which appears intimately associated with the right lower lobe bronchi. This was suspicious for carcinoid tumor. The patient was referred to Dr. Roxan Little and on 03/18/2016 she underwent right VATS with right middle and lower bilobectomies with lymph node dissection. 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 The tumor involves the lobar bronchus. There was no evidence for lymphovascular invasion or lymph node involvement. The patient is recovering well from her surgery. Dr. Roxan Little kindly referred the patient to me today for further evaluation  and recommendation regarding her condition. When seen today the patient is feeling fine with no specific complaints. She denied having any significant chest pain, shortness breath, cough or hemoptysis. She has no nausea, vomiting, diarrhea or constipation. She has no headache or visual changes. Family history significant for father with diabetes mellitus, mother is healthy The patient is single and has 2 children age 46 and 67. She works in a Facilities manager and skin care facility. She has no history of smoking, alcohol or drug abuse.  HPI  Past Medical History  Diagnosis Date  . Mass of lower lobe of right lung 03/05/2016    3.2 cm mass RLL, involving bronchus intermedius  . Constipation     Past Surgical History  Procedure Laterality Date  . Ovarian cyst removal Left 06/30/2014  . Video bronchoscopy N/A 03/18/2016    Procedure: VIDEO BRONCHOSCOPY;  Surgeon: Cathy Nakayama, MD;  Location: [facility];  Service: Thoracic;  Laterality: N/A;  . Video assisted thoracoscopy (vats)/ lobectomy Right 03/18/2016    Procedure: VIDEO ASSISTED THORACOSCOPY (VATS)/RIGHT MIDDLE AND LOWER  BILOBECTOMY ;  Surgeon: Cathy Nakayama, MD;  Location: [facility];  Service: Thoracic;  Laterality: Right;  . Video bronchoscopy with insertion of interbronchial valve (ibv) N/A 04/01/2016    Procedure: VIDEO BRONCHOSCOPY WITH INSERTION OF (2) 40m and (1) 922mSPIRATION INTERBRONCHIAL VALVES (IBV);  Surgeon: Cathy NakayamaMD;  [facility] Service: Thoracic;  Laterality: N/A;    History reviewed. No pertinent family history.  Social History Social History  Substance Use Topics  . Smoking status: Never Smoker   . Smokeless tobacco: Never Used  .  Alcohol Use: No    No Known Allergies  Current Outpatient Prescriptions  Medication Sig Dispense Refill  . acetaminophen (TYLENOL) 500 MG tablet Take 2 tablets (1,000 mg total) by mouth every 6 (six) hours as needed for mild pain, fever or headache. 30 tablet 0  .  naproxen (NAPROSYN) 500 MG tablet Take 1 tablet (500 mg total) by mouth 2 (two) times daily as needed. 60 tablet 1  . oxyCODONE (OXY IR/ROXICODONE) 5 MG immediate release tablet Take 1-2 tablets (5-10 mg total) by mouth every 4 (four) hours as needed for severe pain. 30 tablet 0  . polyethylene glycol (MIRALAX / GLYCOLAX) packet Take 17 g by mouth daily. Reported on 04/14/2016     No current facility-administered medications for this visit.    Review of Systems  Constitutional: negative Eyes: negative Ears, nose, mouth, throat, and face: negative Respiratory: negative Cardiovascular: negative Gastrointestinal: negative Genitourinary:negative Integument/breast: negative Hematologic/lymphatic: negative Musculoskeletal:negative Neurological: negative Behavioral/Psych: negative Endocrine: negative Allergic/Immunologic: negative  Physical Exam  KLK:JZPHX, healthy, no distress, well nourished and well developed SKIN: skin color, texture, turgor are normal, no rashes or significant lesions HEAD: Normocephalic, No masses, lesions, tenderness or abnormalities EYES: normal, PERRLA, Conjunctiva are pink and non-injected EARS: External ears normal, Canals clear OROPHARYNX:no exudate, no erythema and lips, buccal mucosa, and tongue normal  NECK: supple, no adenopathy, no JVD LYMPH:  no palpable lymphadenopathy, no hepatosplenomegaly BREAST:not examined LUNGS: clear to auscultation , and palpation HEART: regular rate & rhythm, no murmurs and no gallops ABDOMEN:abdomen soft, non-tender, normal bowel sounds and no masses or organomegaly BACK: Back symmetric, no curvature., No CVA tenderness EXTREMITIES:no joint deformities, effusion, or inflammation, no edema, no skin discoloration  NEURO: alert & oriented x 3 with fluent speech, no focal motor/sensory deficits  PERFORMANCE STATUS: ECOG 0  LABORATORY DATA: Lab Results  Component Value Date   WBC 5.0 05/04/2016   HGB 13.5 05/04/2016    HCT 39.0 05/04/2016   MCV 90.7 05/04/2016   PLT 225 05/04/2016      Chemistry      Component Value Date/Time   NA 139 05/04/2016 1413   NA 137 03/26/2016 0455   K 4.1 05/04/2016 1413   K 3.6 03/26/2016 0455   CL 100* 03/26/2016 0455   CO2 23 05/04/2016 1413   CO2 30 03/26/2016 0455   BUN 8.9 05/04/2016 1413   BUN 13 03/26/2016 0455   CREATININE 0.7 05/04/2016 1413   CREATININE 0.46 03/26/2016 0455      Component Value Date/Time   CALCIUM 9.6 05/04/2016 1413   CALCIUM 9.4 03/26/2016 0455   ALKPHOS 100 05/04/2016 1413   ALKPHOS 48 03/20/2016 0310   AST 17 05/04/2016 1413   AST 19 03/20/2016 0310   ALT 13 05/04/2016 1413   ALT 16 03/20/2016 0310   BILITOT <0.30 05/04/2016 1413   BILITOT 0.6 03/20/2016 0310       RADIOGRAPHIC STUDIES: Dg Chest 2 View  04/14/2016  CLINICAL DATA:  Right lower lobe lung mass, right chest pain. EXAM: CHEST  2 VIEW COMPARISON:  Radiograph of April 05, 2016. FINDINGS: The heart size and mediastinal contours are within normal limits. Left lung is clear. Mild right apical pneumothorax is noted which is smaller compared to prior exam. Stable postsurgical changes are seen involving the right lower lobe. No significant pleural effusion is noted. The visualized skeletal structures are unremarkable. IMPRESSION: Stable postsurgical changes seen in right lower lobe. Mild right apical pneumothorax is noted which is smaller compared to  prior exam. Electronically Signed   By: Marijo Conception, M.D.   On: 04/14/2016 14:11   Dg Chest 2 View  04/05/2016  CLINICAL DATA:  Post lobectomy. EXAM: CHEST  2 VIEW COMPARISON:  04/04/2016 FINDINGS: Stable postsurgical changes from right lower and middle lobectomy with volume loss in the right hemithorax. Cardiomediastinal silhouette is normal. Mediastinal contours appear intact. There is no evidence of focal airspace consolidation, or pleural effusion. There is persistent moderate in size right apical pneumothorax, not  significantly changed from the prior radiograph. Osseous structures are without acute abnormality. Soft tissues are grossly normal. IMPRESSION: Persistent moderate in size right apical pneumothorax, status post right lobectomy. Electronically Signed   By: Fidela Salisbury M.D.   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   PLAN: I had a lengthy discussion with the patient and her brother today about her current disease stage, prognosis and treatment options. I explained to the patient that there is no survival benefit for adjuvant systemic chemotherapy or radiotherapy for patient with stage IA carcinoid tumor. I explained to the patient that the current standard of care is observation and close monitoring. I will arrange for the patient to come back for follow-up visit in 6 months with repeat CT scan of the chest for restaging of her disease. She was advised to call immediately if she has any concerning findings in the interval. The patient and her brother agree to the current plan.  The patient voices understanding of current disease status and treatment options and is in agreement with the current care plan.  All questions were answered. The patient knows to call the clinic with any problems, questions or concerns. We can certainly see the patient much sooner if necessary.  Thank you so much for allowing me to participate in the care of Cathy Little. I will continue to follow up the patient with you and assist in her care.  I spent 40 minutes counseling the patient face to face. The total time spent in the appointment was 60 minutes.  Disclaimer: This note was dictated with voice recognition software. Similar sounding words can inadvertently be transcribed and may not  be corrected upon review.   Madoc Holquin K. May 04, 2016, 3:35 PM

## 2016-05-18 ENCOUNTER — Telehealth: Payer: Self-pay

## 2016-05-18 ENCOUNTER — Ambulatory Visit: Payer: BLUE CROSS/BLUE SHIELD | Admitting: Obstetrics & Gynecology

## 2016-05-18 NOTE — Telephone Encounter (Signed)
-----   Message from [name], MD sent at 03/18/2016 10:54 AM EDT ----- Please call pt. Pap smear was neg, but please tell her to make/keep OB appt to f/u w/ ulcerations of cervix.

## 2016-05-18 NOTE — Telephone Encounter (Signed)
Mailed pt PAP results due to her presently being hospitalized.

## 2016-05-29 ENCOUNTER — Ambulatory Visit (HOSPITAL_COMMUNITY)
Admission: RE | Admit: 2016-05-29 | Discharge: 2016-05-29 | Disposition: A | Discharge status: Home / Self Care | Payer: BLUE CROSS/BLUE SHIELD | Source: Ambulatory Visit | Attending: Thoracic Surgery (Cardiothoracic Vascular Surgery) | Admitting: Thoracic Surgery (Cardiothoracic Vascular Surgery)

## 2016-05-29 ENCOUNTER — Encounter (HOSPITAL_COMMUNITY): Payer: Self-pay

## 2016-05-29 ENCOUNTER — Encounter: Payer: Self-pay | Admitting: Obstetrics & Gynecology

## 2016-05-29 ENCOUNTER — Encounter (HOSPITAL_COMMUNITY)
Admission: RE | Admit: 2016-05-29 | Discharge: 2016-05-29 | Disposition: A | Discharge status: Home / Self Care | Payer: BLUE CROSS/BLUE SHIELD | Source: Ambulatory Visit | Attending: Thoracic Surgery (Cardiothoracic Vascular Surgery) | Admitting: Thoracic Surgery (Cardiothoracic Vascular Surgery)

## 2016-05-29 ENCOUNTER — Ambulatory Visit (INDEPENDENT_AMBULATORY_CARE_PROVIDER_SITE_OTHER): Payer: BLUE CROSS/BLUE SHIELD | Admitting: Obstetrics & Gynecology

## 2016-05-29 VITALS — BP 105/66 | HR 96 | Wt 115.7 lb

## 2016-05-29 DIAGNOSIS — D3A00 Benign carcinoid tumor of unspecified site: Secondary | ICD-10-CM | POA: Insufficient documentation

## 2016-05-29 DIAGNOSIS — N898 Other specified noninflammatory disorders of vagina: Secondary | ICD-10-CM

## 2016-05-29 DIAGNOSIS — J948 Other specified pleural conditions: Secondary | ICD-10-CM | POA: Diagnosis not present

## 2016-05-29 DIAGNOSIS — Z01812 Encounter for preprocedural laboratory examination: Secondary | ICD-10-CM | POA: Insufficient documentation

## 2016-05-29 DIAGNOSIS — Z01818 Encounter for other preprocedural examination: Secondary | ICD-10-CM | POA: Insufficient documentation

## 2016-05-29 HISTORY — DX: Reserved for inherently not codable concepts without codable children: IMO0001

## 2016-05-29 LAB — COMPREHENSIVE METABOLIC PANEL
ALT: 14 U/L (ref 14–54)
ANION GAP: 9 (ref 5–15)
AST: 16 U/L (ref 15–41)
Albumin: 4.2 g/dL (ref 3.5–5.0)
Alkaline Phosphatase: 76 U/L (ref 38–126)
BUN: 14 mg/dL (ref 6–20)
CHLORIDE: 104 mmol/L (ref 101–111)
CO2: 24 mmol/L (ref 22–32)
Calcium: 9.7 mg/dL (ref 8.9–10.3)
Creatinine, Ser: 0.6 mg/dL (ref 0.44–1.00)
GFR calc non Af Amer: >60 mL/min (ref >60)
Glucose, Bld: 90 mg/dL (ref 65–99)
Potassium: 3.7 mmol/L (ref 3.5–5.1)
SODIUM: 137 mmol/L (ref 135–145)
Total Bilirubin: 0.6 mg/dL (ref 0.3–1.2)
Total Protein: 8 g/dL (ref 6.5–8.1)

## 2016-05-29 LAB — CBC
HEMATOCRIT: 40.4 % (ref 36.0–46.0)
HEMOGLOBIN: 13.6 g/dL (ref 12.0–15.0)
MCH: 30.9 pg (ref 26.0–34.0)
MCHC: 33.7 g/dL (ref 30.0–36.0)
MCV: 91.8 fL (ref 78.0–100.0)
Platelets: 198 10*3/uL (ref 150–400)
RBC: 4.4 MIL/uL (ref 3.87–5.11)
RDW: 13.3 % (ref 11.5–15.5)
WBC: 5 10*3/uL (ref 4.0–10.5)

## 2016-05-29 LAB — SURGICAL PCR SCREEN
MRSA, PCR: NEGATIVE
Staphylococcus aureus: POSITIVE — AB

## 2016-05-29 LAB — APTT: aPTT: 33 seconds (ref 24–36)

## 2016-05-29 LAB — PROTIME-INR
INR: 1.09
PROTHROMBIN TIME: 14.1 s (ref 11.4–15.2)

## 2016-05-29 LAB — HCG, SERUM, QUALITATIVE: Preg, Serum: NEGATIVE

## 2016-05-29 NOTE — Progress Notes (Signed)
[facility] # [phone] Vaginal discharge is beige colored with no odor   History:  40 y.o. G2P2 here today for eval of discharge. She does not know if it is abnormal or not.  She reports that it seems heavier at night.  There is no odor an dis not assoc with pain.  She is monogamous and not worried about STI's.   The following portions of the patient's history were reviewed and updated as appropriate: allergies, current medications, past family history, past medical history, past social history, past surgical history and problem list.  Review of Systems:  Pertinent items are noted in HPI.  Objective:  Physical Exam Blood pressure 105/66, pulse 96, weight 115 lb 11.2 oz (52.5 kg), last menstrual period 05/14/2016. Gen: NAD Abd: Soft, nontender and nondistended Pelvic: Normal appearing external genitalia; normal appearing vaginal mucosa and cervix.  Normal discharge.  Small uterus, no other palpable masses, no uterine or adnexal tenderness  Labs and Imaging No results found.  Assessment & Plan:  Leukorrhea- not to r/o BV.  F/u Affirm wet mount. F/u prn  Kresha Abelson L. Harraway-Smith, M.D., Cherlynn June

## 2016-05-29 NOTE — Addendum Note (Signed)
Addended by: Michel Harrow on: 05/29/2016 10:19 AM   Modules accepted: Orders

## 2016-05-29 NOTE — Pre-Procedure Instructions (Signed)
    Cathy Little  05/29/2016    Your procedure is scheduled on Monday, August 14.  Report to [facility] Admitting at 5:30 A.M.                Your surgery or procedure is scheduled for 7:30 AM   Call this number if you have problems the morning of surgery:[phone]   Remember:  Do not eat food or drink liquids after midnight Sunday, August 13.  Take these medicines the morning of surgery with A SIP OF WATER : acetaminophen (TYLENOL) or oxyCODONE (OXY IR/ROXICODONE).   Do not wear jewelry, make-up or nail polish.  Do not wear lotions, powders, or perfumes.  .  Do not shave 48 hours prior to surgery  Do not bring valuables to the hospital.  [facility] is not responsible for any belongings or valuables.  Contacts, dentures or bridgework may not be worn into surgery.  Leave your suitcase in the car.  After surgery it may be brought to your room.  For patients admitted to the hospital, discharge time will be determined by your treatment team.  Patients discharged the day of surgery will not be allowed to drive home.

## 2016-05-29 NOTE — Progress Notes (Signed)
I called a prescription for Mupirocin ointment to [facility], [address], [place], [place].

## 2016-05-31 NOTE — Anesthesia Preprocedure Evaluation (Signed)
Anesthesia Evaluation  Patient identified by MRN, date of birth, ID band Patient awake    Reviewed: Allergy & Precautions, NPO status , Patient's Chart, lab work & pertinent test results  Airway Mallampati: II   Neck ROM: full    Dental   Pulmonary  RLL lung mass.  S/p VATS 03/18/16. Subsequent interbronchial valve for persistent air-leak.   breath sounds clear to auscultation       Cardiovascular negative cardio ROS   Rhythm:regular Rate:Normal     Neuro/Psych    GI/Hepatic   Endo/Other    Renal/GU      Musculoskeletal   Abdominal   Peds  Hematology   Anesthesia Other Findings   Reproductive/Obstetrics                             Lab Results  Component Value Date   WBC 5.0 05/29/2016   HGB 13.6 05/29/2016   HCT 40.4 05/29/2016   MCV 91.8 05/29/2016   PLT 198 05/29/2016   Lab Results  Component Value Date   CREATININE 0.60 05/29/2016   BUN 14 05/29/2016   NA 137 05/29/2016   K 3.7 05/29/2016   CL 104 05/29/2016   CO2 24 05/29/2016    Anesthesia Physical  Anesthesia Plan  ASA: II  Anesthesia Plan: General   Post-op Pain Management:    Induction: Intravenous  Airway Management Planned: Oral ETT  Additional Equipment:   Intra-op Plan:   Post-operative Plan: Extubation in OR  Informed Consent:   Plan Discussed with:   Anesthesia Plan Comments:         Anesthesia Quick Evaluation

## 2016-06-01 ENCOUNTER — Encounter (HOSPITAL_COMMUNITY)
Admission: RE | Disposition: A | Discharge status: Home / Self Care | Payer: Self-pay | Source: Ambulatory Visit | Attending: Thoracic Surgery (Cardiothoracic Vascular Surgery)

## 2016-06-01 ENCOUNTER — Ambulatory Visit (HOSPITAL_COMMUNITY): Payer: BLUE CROSS/BLUE SHIELD | Admitting: Anesthesiology

## 2016-06-01 ENCOUNTER — Ambulatory Visit (HOSPITAL_COMMUNITY)
Admission: RE | Admit: 2016-06-01 | Discharge: 2016-06-01 | Disposition: A | Discharge status: Home / Self Care | Payer: BLUE CROSS/BLUE SHIELD | Source: Ambulatory Visit | Attending: Thoracic Surgery (Cardiothoracic Vascular Surgery) | Admitting: Thoracic Surgery (Cardiothoracic Vascular Surgery)

## 2016-06-01 ENCOUNTER — Encounter (HOSPITAL_COMMUNITY): Payer: Self-pay | Admitting: Surgery

## 2016-06-01 ENCOUNTER — Ambulatory Visit (HOSPITAL_COMMUNITY): Payer: BLUE CROSS/BLUE SHIELD

## 2016-06-01 DIAGNOSIS — J95812 Postprocedural air leak: Secondary | ICD-10-CM | POA: Diagnosis not present

## 2016-06-01 DIAGNOSIS — D3A00 Benign carcinoid tumor of unspecified site: Secondary | ICD-10-CM

## 2016-06-01 DIAGNOSIS — Z4689 Encounter for fitting and adjustment of other specified devices: Secondary | ICD-10-CM | POA: Diagnosis present

## 2016-06-01 DIAGNOSIS — Z419 Encounter for procedure for purposes other than remedying health state, unspecified: Secondary | ICD-10-CM

## 2016-06-01 DIAGNOSIS — Z9889 Other specified postprocedural states: Secondary | ICD-10-CM

## 2016-06-01 HISTORY — PX: VIDEO BRONCHOSCOPY WITH INSERTION OF INTERBRONCHIAL VALVE (IBV): SHX6178

## 2016-06-01 LAB — CERVICOVAGINAL ANCILLARY ONLY: Wet Prep (BD Affirm): POSITIVE — AB

## 2016-06-01 SURGERY — BRONCHOSCOPY, FLEXIBLE, WITH INTRABRONCHIAL VALVE INSERTION
Anesthesia: General

## 2016-06-01 MED ORDER — PHENYLEPHRINE 40 MCG/ML (10ML) SYRINGE FOR IV PUSH (FOR BLOOD PRESSURE SUPPORT)
PREFILLED_SYRINGE | INTRAVENOUS | Status: AC
  Filled 2016-06-01: qty 10

## 2016-06-01 MED ORDER — CEFAZOLIN SODIUM-DEXTROSE 2-4 GM/100ML-% IV SOLN
INTRAVENOUS | Status: AC
  Filled 2016-06-01: qty 100

## 2016-06-01 MED ORDER — LACTATED RINGERS IV SOLN
INTRAVENOUS | Status: DC | PRN
  Administered 2016-06-01: 07:00:00 via INTRAVENOUS

## 2016-06-01 MED ORDER — FENTANYL CITRATE (PF) 100 MCG/2ML IJ SOLN: 25.0000 ug | INTRAMUSCULAR | Status: DC | PRN

## 2016-06-01 MED ORDER — PROPOFOL 10 MG/ML IV BOLUS
INTRAVENOUS | Status: DC | PRN
  Administered 2016-06-01: 100 mg via INTRAVENOUS

## 2016-06-01 MED ORDER — ROCURONIUM BROMIDE 100 MG/10ML IV SOLN
INTRAVENOUS | Status: DC | PRN
  Administered 2016-06-01: 30 mg via INTRAVENOUS

## 2016-06-01 MED ORDER — GLYCOPYRROLATE 0.2 MG/ML IJ SOLN
INTRAMUSCULAR | Status: DC | PRN
  Administered 2016-06-01: 0.1 mg via INTRAVENOUS
  Administered 2016-06-01: 0.4 mg via INTRAVENOUS

## 2016-06-01 MED ORDER — PHENYLEPHRINE HCL 10 MG/ML IJ SOLN
INTRAMUSCULAR | Status: DC | PRN
  Administered 2016-06-01: 20 ug/min via INTRAVENOUS

## 2016-06-01 MED ORDER — DEXAMETHASONE SODIUM PHOSPHATE 10 MG/ML IJ SOLN
INTRAMUSCULAR | Status: DC | PRN
  Administered 2016-06-01: 10 mg via INTRAVENOUS

## 2016-06-01 MED ORDER — FENTANYL CITRATE (PF) 100 MCG/2ML IJ SOLN
INTRAMUSCULAR | Status: DC | PRN
  Administered 2016-06-01: 50 ug via INTRAVENOUS

## 2016-06-01 MED ORDER — ONDANSETRON HCL 4 MG/2ML IJ SOLN
INTRAMUSCULAR | Status: AC
  Filled 2016-06-01: qty 2

## 2016-06-01 MED ORDER — MIDAZOLAM HCL 2 MG/2ML IJ SOLN
INTRAMUSCULAR | Status: AC
  Filled 2016-06-01: qty 2

## 2016-06-01 MED ORDER — NEOSTIGMINE METHYLSULFATE 10 MG/10ML IV SOLN
INTRAVENOUS | Status: DC | PRN
  Administered 2016-06-01: 3 mg via INTRAVENOUS

## 2016-06-01 MED ORDER — MIDAZOLAM HCL 5 MG/5ML IJ SOLN
INTRAMUSCULAR | Status: DC | PRN
  Administered 2016-06-01: 2 mg via INTRAVENOUS

## 2016-06-01 MED ORDER — FENTANYL CITRATE (PF) 250 MCG/5ML IJ SOLN
INTRAMUSCULAR | Status: AC
  Filled 2016-06-01: qty 5

## 2016-06-01 MED ORDER — PROMETHAZINE HCL 25 MG/ML IJ SOLN: 6.2500 mg | INTRAMUSCULAR | Status: DC | PRN

## 2016-06-01 MED ORDER — 0.9 % SODIUM CHLORIDE (POUR BTL) OPTIME
TOPICAL | Status: DC | PRN
  Administered 2016-06-01: 1000 mL

## 2016-06-01 MED ORDER — EPINEPHRINE HCL 1 MG/ML IJ SOLN
INTRAMUSCULAR | Status: AC
  Filled 2016-06-01: qty 1

## 2016-06-01 MED ORDER — PROPOFOL 10 MG/ML IV BOLUS
INTRAVENOUS | Status: AC
  Filled 2016-06-01: qty 20

## 2016-06-01 MED ORDER — EPHEDRINE SULFATE 50 MG/ML IJ SOLN
INTRAMUSCULAR | Status: AC
  Filled 2016-06-01: qty 1

## 2016-06-01 MED ORDER — LIDOCAINE HCL (CARDIAC) 20 MG/ML IV SOLN
INTRAVENOUS | Status: DC | PRN
  Administered 2016-06-01: 50 mg via INTRAVENOUS

## 2016-06-01 MED ORDER — ONDANSETRON HCL 4 MG/2ML IJ SOLN
INTRAMUSCULAR | Status: DC | PRN
  Administered 2016-06-01: 4 mg via INTRAVENOUS

## 2016-06-01 MED ORDER — PHENYLEPHRINE HCL 10 MG/ML IJ SOLN
INTRAMUSCULAR | Status: DC | PRN
  Administered 2016-06-01 (×2): 80 ug via INTRAVENOUS

## 2016-06-01 SURGICAL SUPPLY — 26 items
CANISTER SUCTION 2500CC (MISCELLANEOUS) ×2 IMPLANT
CONT SPEC 4OZ CLIKSEAL STRL BL (MISCELLANEOUS) ×2 IMPLANT
COVER TABLE BACK 60X90 (DRAPES) ×2 IMPLANT
DRAPE PROXIMA HALF (DRAPES) ×2 IMPLANT
FILTER STRAW FLUID ASPIR (MISCELLANEOUS) IMPLANT
FORCEPS BIOP RJ4 1.8 (CUTTING FORCEPS) IMPLANT
FORCEPS RADIAL JAW LRG 4 PULM (INSTRUMENTS) ×1 IMPLANT
GAUZE SPONGE 4X4 12PLY STRL (GAUZE/BANDAGES/DRESSINGS) ×2 IMPLANT
GLOVE SURG SIGNA 7.5 PF LTX (GLOVE) ×2 IMPLANT
GOWN STRL REUS W/ TWL XL LVL3 (GOWN DISPOSABLE) ×1 IMPLANT
GOWN STRL REUS W/TWL XL LVL3 (GOWN DISPOSABLE) ×1
KIT CLEAN ENDO COMPLIANCE (KITS) ×2 IMPLANT
KIT ROOM TURNOVER OR (KITS) ×2 IMPLANT
MARKER SKIN DUAL TIP RULER LAB (MISCELLANEOUS) ×2 IMPLANT
NS IRRIG 1000ML POUR BTL (IV SOLUTION) ×2 IMPLANT
OIL SILICONE PENTAX (PARTS (SERVICE/REPAIRS)) ×2 IMPLANT
PAD ARMBOARD 7.5X6 YLW CONV (MISCELLANEOUS) ×4 IMPLANT
RADIAL JAW LRG 4 PULMONARY (INSTRUMENTS) ×1
SPONGE GAUZE 4X4 12PLY STER LF (GAUZE/BANDAGES/DRESSINGS) ×2 IMPLANT
STOPCOCK MORSE 400PSI 3WAY (MISCELLANEOUS) ×2 IMPLANT
SYR 20ML ECCENTRIC (SYRINGE) ×2 IMPLANT
SYRINGE 10CC LL (SYRINGE) ×2 IMPLANT
TOWEL NATURAL 4PK STERILE (DISPOSABLE) IMPLANT
TRAP SPECIMEN MUCOUS 40CC (MISCELLANEOUS) ×2 IMPLANT
TUBE CONNECTING 20X1/4 (TUBING) ×2 IMPLANT
UNDERPAD 30X30 INCONTINENT (UNDERPADS AND DIAPERS) IMPLANT

## 2016-06-01 NOTE — Transfer of Care (Signed)
Immediate Anesthesia Transfer of Care Note  Patient: Cathy Little  Procedure(s) Performed: Procedure(s): VIDEO BRONCHOSCOPY WITH REMOVAL OF INTERBRONCHIAL VALVE (IBV) (N/A)  Patient Location: PACU  Anesthesia Type:General  Level of Consciousness: awake, alert  and oriented  Airway & Oxygen Therapy: Patient Spontanous Breathing and Patient connected to nasal cannula oxygen  Post-op Assessment: Report given to RN, Post -op Vital signs reviewed and stable and Patient moving all extremities X 4  Post vital signs: Reviewed and stable  Last Vitals:  Vitals:   06/01/16 0554 06/01/16 0821  BP: 99/62 106/73  Pulse: 74 (!) 115  Resp: 18 15  Temp: 36.7 C 36.7 C    Last Pain:  Vitals:   06/01/16 0821  TempSrc:   PainSc: 0-No pain         Complications: No apparent anesthesia complications

## 2016-06-01 NOTE — Interval H&P Note (Signed)
History and Physical Interval Note: CXR shows a small loculated space, but overall OK  06/01/2016 7:22 AM  Cathy Little  has presented today for surgery, with the diagnosis of INTERBRONCHIAL VALVES  The various methods of treatment have been discussed with the patient and family. After consideration of risks, benefits and other options for treatment, the patient has consented to  Procedure(s): VIDEO BRONCHOSCOPY WITH REMOVAL OF INTERBRONCHIAL VALVE (IBV) (N/A) as a surgical intervention .  The patient's history has been reviewed, patient examined, no change in status, stable for surgery.  I have reviewed the patient's chart and labs.  Questions were answered to the patient's satisfaction.     Melrose Nakayama

## 2016-06-01 NOTE — H&P (Signed)
[address]       [place] [address]             [phone]                                                           HPI: Ms. Maple returns today for scheduled postoperative follow-up visit.  She is a 40 year old woman who recently moved to [place] from [place]. She was found to have a right lung mass. She underwent a right lower and middle bilobectomy on 03/18/2016. The tumor was a carcinoid. Lymph nodes were negative.  She had a prolonged air leak postoperatively and on 04/01/2016 interbronchial valves were placed. Her air leak resolved and her chest tube was removed. She was discharged on 04/05/2016.  Since discharge she's been feeling well. She does have some incisional discomfort primarily along the right costal margin. She is using pain medications sparingly. She has not engaged any heavy exercise.       Past Medical History  Diagnosis Date  . Mass of lower lobe of right lung 03/05/2016    3.2 cm mass RLL, involving bronchus intermedius  . Constipation             Current Outpatient Prescriptions  Medication Sig Dispense Refill  . acetaminophen (TYLENOL) 500 MG tablet Take 2 tablets (1,000 mg total) by mouth every 6 (six) hours as needed for mild pain, fever or headache. 30 tablet 0  . oxyCODONE (OXY IR/ROXICODONE) 5 MG immediate release tablet Take 1-2 tablets (5-10 mg total) by mouth every 4 (four) hours as needed for severe pain. 30 tablet 0  . polyethylene glycol (MIRALAX / GLYCOLAX) packet Take 17 g by mouth daily. Reported on 04/14/2016     No current facility-administered medications for this visit.    Physical Exam BP 99/67 mmHg  Pulse 100  Resp 20  Ht '5\' 6"'$  (1.676 m)  Wt 103 lb (46.72 kg)  BMI 16.63 kg/m2  SpO2 95%  LMP 03/01/2016 (Exact Date) 40 year old woman in no acute distress Alert and oriented 3 with no focal deficits Absent breath sounds right base, clear and upper lung field Incision healing well, some  eschar at chest tube site  Diagnostic Tests: CHEST 2 VIEW  COMPARISON: Radiograph of April 05, 2016.  FINDINGS: The heart size and mediastinal contours are within normal limits. Left lung is clear. Mild right apical pneumothorax is noted which is smaller compared to prior exam. Stable postsurgical changes are seen involving the right lower lobe. No significant pleural effusion is noted. The visualized skeletal structures are unremarkable.  IMPRESSION: Stable postsurgical changes seen in right lower lobe. Mild right apical pneumothorax is noted which is smaller compared to prior exam.   Electronically Signed  By: Marijo Conception, M.D.  On: 04/14/2016 14:11  I personally reviewed the chest x-ray. She has a small residual space/ pneumothorax.  Impression: Ms. Boston is a 40 year old woman who recently had a right lower and middle bilobectomy for a stage IA typical carcinoid tumor. Her postoperative course was complicated by prolonged air leak requiring interbronchial valve replacement. She was finally discharged on 04/05/2016.  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 She should not need adjuvant chemotherapy. We will have her see Dr. Julien Nordmann in consultation to get his  opinion. I will arrange that appointment.  Prolonged air leak- resolved with interbronchial valve replacement. In her case I want to wait a full 8 weeks before removing the valves. We will schedule valve removal for Monday, August 14. I did discuss the general nature of the procedure. We will do this under general anesthesia. She understands there are no incisions. She understands there is a remote risk of recurrent pneumothorax after valve removal.  Plan: Referral to [place] to see Dr. Julien Nordmann  Bronchoscopy with valve removal on Monday, 06/01/2016  Melrose Nakayama, MD [facility] [phone]

## 2016-06-01 NOTE — Anesthesia Procedure Notes (Signed)
Procedure Name: Intubation Date/Time: 06/01/2016 7:41 AM Performed by: Rejeana Brock L Pre-anesthesia Checklist: Patient identified, Emergency Drugs available, Suction available and Patient being monitored Patient Re-evaluated:Patient Re-evaluated prior to inductionOxygen Delivery Method: Circle System Utilized Preoxygenation: Pre-oxygenation with 100% oxygen Intubation Type: IV induction Ventilation: Mask ventilation without difficulty Laryngoscope Size: Mac and 3 Grade View: Grade II Tube type: Oral Tube size: 8.5 mm Number of attempts: 1 Airway Equipment and Method: Stylet and Oral airway Placement Confirmation: ETT inserted through vocal cords under direct vision,  positive ETCO2 and breath sounds checked- equal and bilateral Secured at: 22 cm Tube secured with: Tape Dental Injury: Teeth and Oropharynx as per pre-operative assessment

## 2016-06-01 NOTE — Discharge Instructions (Addendum)
Do not drive or engage in heavy physical activity for 24 hours  You may resume normal activities tomorrow  You may cough up small amounts of blood. Call if you cough up more than a tablespoon of blood.  You may use over the counter cough medication if needed  Call [phone] if you have chest pain, shortness of breath or fever > 101 F

## 2016-06-01 NOTE — Op Note (Signed)
Cathy Little, Cathy Little NO.:  [id number]  MEDICAL RECORD NO.:  [id number]  LOCATION:  [facility]                         FACILITY:  [facility]  PHYSICIAN:  Revonda Standard. Roxan Hockey, M.D.DATE OF BIRTH:  1976/07/17  DATE OF PROCEDURE:  06/01/2016 DATE OF DISCHARGE:                              OPERATIVE REPORT   PREOPERATIVE DIAGNOSIS:  Intrabronchial valves.  POSTOPERATIVE DIAGNOSIS:  Intrabronchial valves.  PROCEDURE:  Video bronchoscopy with removal of intrabronchial valves.  SURGEON:  Revonda Standard. Roxan Hockey, M.D.  ANESTHESIA:  General.  FINDINGS:  Valves removed intact without difficulty.  Minimal bleeding with valve removal.  CLINICAL NOTE:  [name] is a 40 year old woman who had a right bilobectomy for a carcinoid tumor complicated by a prolonged air leak. Intrabronchial valves were placed and had now been in a sufficient period of time and need to be removed.  The indications, risks, benefits, and alternatives were discussed in detail with the patient.  She accepted the risks and agreed to proceed.  OPERATIVE NOTE:  [name] was brought to the operating room on June 01, 2016.  She had induction of general anesthesia and was intubated. Flexible fiberoptic bronchoscopy was performed via the endotracheal tube.  There was good healing of the bronchus intermedius stump.  The valves were seen in the right upper lobe segmental bronchi.  All 3 valves were extracted intact without difficulty.  There was minimal bleeding from the mucosa.  After irrigation with saline, there was no ongoing bleeding.  The bronchoscope was removed.  The patient was extubated in the operating room and taken to the postanesthetic care unit in good condition.     Revonda Standard Roxan Hockey, M.D.     SCH/MEDQ  D:  06/01/2016  T:  06/01/2016  Job:  [id number]

## 2016-06-01 NOTE — OR Nursing (Signed)
Three interbronchial valves removed by Dr. Roxan Hockey.

## 2016-06-01 NOTE — Brief Op Note (Addendum)
06/01/2016  8:07 AM  PATIENT:  Cathy Little  40 y.o. female  PRE-OPERATIVE DIAGNOSIS:  INTERBRONCHIAL VALVES  POST-OPERATIVE DIAGNOSIS:  INTERBRONCHIAL VALVES  PROCEDURE:  Procedure(s): VIDEO BRONCHOSCOPY WITH REMOVAL OF INTERBRONCHIAL VALVE (IBV) (N/A)  SURGEON:  Surgeon(s) and Role:    * Melrose Nakayama, MD - Primary  ASSISTANTS: none   ANESTHESIA:   general  EBL:  No intake/output data recorded.  BLOOD ADMINISTERED:none  DRAINS: none   LOCAL MEDICATIONS USED:  NONE  SPECIMEN:  No Specimen  DISPOSITION OF SPECIMEN:  N/A  PLAN OF CARE: Discharge to home after PACU  PATIENT DISPOSITION:  PACU - hemodynamically stable.   Delay start of Pharmacological VTE agent (>24hrs) due to surgical blood loss or risk of bleeding: not applicable  Dictation # [id number]

## 2016-06-01 NOTE — Anesthesia Postprocedure Evaluation (Signed)
Anesthesia Post Note  Patient: Cathy Little  Procedure(s) Performed: Procedure(s) (LRB): VIDEO BRONCHOSCOPY WITH REMOVAL OF INTERBRONCHIAL VALVE (IBV) (N/A)  Patient location during evaluation: PACU Anesthesia Type: General Level of consciousness: awake and alert Pain management: pain level controlled Vital Signs Assessment: post-procedure vital signs reviewed and stable Respiratory status: spontaneous breathing, nonlabored ventilation, respiratory function stable and patient connected to nasal cannula oxygen Cardiovascular status: blood pressure returned to baseline and stable Postop Assessment: no signs of nausea or vomiting Anesthetic complications: no    Last Vitals:  Vitals:   06/01/16 0945 06/01/16 0956  BP: (!) 98/52 96/75  Pulse: 78 77  Resp: 19 20  Temp:      Last Pain:  Vitals:   06/01/16 0956  TempSrc:   PainSc: 0-No pain                 Tiajuana Amass

## 2016-06-02 ENCOUNTER — Encounter (HOSPITAL_COMMUNITY): Payer: Self-pay | Admitting: Thoracic Surgery (Cardiothoracic Vascular Surgery)

## 2016-06-02 ENCOUNTER — Other Ambulatory Visit: Payer: Self-pay | Admitting: Obstetrics & Gynecology

## 2016-06-02 DIAGNOSIS — B373 Candidiasis of vulva and vagina: Secondary | ICD-10-CM

## 2016-06-02 DIAGNOSIS — B3731 Acute candidiasis of vulva and vagina: Secondary | ICD-10-CM

## 2016-06-02 MED ORDER — FLUCONAZOLE 150 MG PO TABS: 150.0000 mg | ORAL_TABLET | Freq: Once | ORAL | 0 refills | Status: AC

## 2016-06-05 NOTE — Progress Notes (Signed)
No answer or voicemail to leave a message for patient

## 2016-06-08 ENCOUNTER — Other Ambulatory Visit: Payer: Self-pay | Admitting: Thoracic Surgery (Cardiothoracic Vascular Surgery)

## 2016-06-08 DIAGNOSIS — D3A090 Benign carcinoid tumor of the bronchus and lung: Secondary | ICD-10-CM

## 2016-06-09 ENCOUNTER — Ambulatory Visit (INDEPENDENT_AMBULATORY_CARE_PROVIDER_SITE_OTHER): Payer: Self-pay | Admitting: Thoracic Surgery (Cardiothoracic Vascular Surgery)

## 2016-06-09 ENCOUNTER — Ambulatory Visit
Admission: RE | Admit: 2016-06-09 | Discharge: 2016-06-09 | Disposition: A | Discharge status: Home / Self Care | Payer: BLUE CROSS/BLUE SHIELD | Source: Ambulatory Visit | Attending: Thoracic Surgery (Cardiothoracic Vascular Surgery) | Admitting: Thoracic Surgery (Cardiothoracic Vascular Surgery)

## 2016-06-09 ENCOUNTER — Encounter: Payer: Self-pay | Admitting: Thoracic Surgery (Cardiothoracic Vascular Surgery)

## 2016-06-09 VITALS — BP 95/62 | HR 91 | Resp 16 | Ht 66.0 in | Wt 115.6 lb

## 2016-06-09 DIAGNOSIS — IMO0002 Reserved for concepts with insufficient information to code with codable children: Secondary | ICD-10-CM

## 2016-06-09 DIAGNOSIS — D1431 Benign neoplasm of right bronchus and lung: Secondary | ICD-10-CM

## 2016-06-09 DIAGNOSIS — Z09 Encounter for follow-up examination after completed treatment for conditions other than malignant neoplasm: Secondary | ICD-10-CM

## 2016-06-09 DIAGNOSIS — J9382 Other air leak: Secondary | ICD-10-CM

## 2016-06-09 DIAGNOSIS — D3A090 Benign carcinoid tumor of the bronchus and lung: Secondary | ICD-10-CM

## 2016-06-09 DIAGNOSIS — Z902 Acquired absence of lung [part of]: Secondary | ICD-10-CM

## 2016-06-09 NOTE — Progress Notes (Signed)
      [address]       [place] [address]             [phone]       HPI: Mrs. Santarelli returns for follow-up after interbronchial valve removal.   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 Nodes were negative. She had a prolonged air leak postoperatively and we eventually placed interbronchial valves. We left those in about 8 weeks and then removed them on 06/01/2016. He tolerated the procedure well on the same day.  She has not had any problems since the valves were removed. She has an occasional twinge of pain but is not requiring any narcotics. She does note a small lump in her right upper quadrant.  Past Medical History:  Diagnosis Date  . Constipation   . 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 Shortness of breath dyspnea     at times- "heat makes it hard to breath- :out side - breathe well.":     Current Outpatient Prescriptions  Medication Sig Dispense Refill  . acetaminophen (TYLENOL) 500 MG tablet Take 2 tablets (1,000 mg total) by mouth every 6 (six) hours as needed for mild pain, fever or headache. 30 tablet 0   No current facility-administered medications for this visit.     Physical Exam BP 95/62   Pulse 91   Resp 16   Ht '5\' 6"'$  (1.676 m)   Wt 115 lb 9.6 oz (52.4 kg)   LMP 06/07/2016   SpO2 99% Comment: ON RA  BMI 18.82 kg/m  40 year old woman in no acute distress Diminished breath sounds right base, otherwise clear Incisions well healed  Diagnostic Tests: CHEST  2 VIEW  COMPARISON:  Chest x-ray of 06/01/2016 and 05/29/2016  FINDINGS: Volume loss in the right hemi thorax is stable after right middle and lower lobectomy. However, there still is and air-fluid level medially at the right lung base consistent with a loop loculated right hydro pneumothorax. The left lung is clear. Mediastinal and hilar  contours are unremarkable. The heart is within normal limits in size. No bony abnormality is seen.  IMPRESSION: 1. Persistent right basilar medial hydro pneumothorax. 2. Stable volume loss in the right hemi thorax.   Electronically Signed   By: Ivar Drape M.D.   On: 06/09/2016 11:23  I personally reviewed the chest x-ray. It shows postoperative changes from a lower and middle bilobectomy. There is a small space medially at the base.  Impression: 40 year old woman who is now about 3 months out from a right lower and middle lobectomy for a carcinoid tumor. She is doing well at this point. She did have a prolonged air leak which required interbronchial valve placement. The valves were removed last week without any issues.  She has an appointment to see Dr. Julien Nordmann in January.   Plan: I'll plan to see her back in February to check on her progress.  Melrose Nakayama, MD [facility] [phone]

## 2016-06-18 ENCOUNTER — Telehealth: Payer: Self-pay | Admitting: *Deleted

## 2016-06-18 NOTE — Telephone Encounter (Signed)
Called patient using [place] interpreter [phone]. Per Dr Ihor Dow, pt has yeast infection and diflucan was prescribed. Patient voiced understanding.

## 2016-06-19 MED FILL — FLUCONAZOLE 150 MG TABLET: 150 | 1 days supply | Qty: 1 | Fill #0

## 2016-10-30 ENCOUNTER — Ambulatory Visit (HOSPITAL_COMMUNITY)
Admission: RE | Admit: 2016-10-30 | Discharge: 2016-10-30 | Disposition: A | Discharge status: Home / Self Care | Payer: BLUE CROSS/BLUE SHIELD | Source: Ambulatory Visit | Attending: Internal Medicine | Admitting: Internal Medicine

## 2016-10-30 ENCOUNTER — Ambulatory Visit (HOSPITAL_COMMUNITY): Payer: BLUE CROSS/BLUE SHIELD

## 2016-10-30 ENCOUNTER — Other Ambulatory Visit (HOSPITAL_BASED_OUTPATIENT_CLINIC_OR_DEPARTMENT_OTHER): Payer: BLUE CROSS/BLUE SHIELD

## 2016-10-30 DIAGNOSIS — D3A090 Benign carcinoid tumor of the bronchus and lung: Secondary | ICD-10-CM

## 2016-10-30 DIAGNOSIS — Z9889 Other specified postprocedural states: Secondary | ICD-10-CM | POA: Diagnosis not present

## 2016-10-30 LAB — CBC WITH DIFFERENTIAL/PLATELET
BASO%: 0.5 % (ref 0.0–2.0)
Basophils Absolute: 0 10*3/uL (ref 0.0–0.1)
EOS ABS: 0.1 10*3/uL (ref 0.0–0.5)
EOS%: 1.4 % (ref 0.0–7.0)
HCT: 41.1 % (ref 34.8–46.6)
HGB: 14.1 g/dL (ref 11.6–15.9)
LYMPH%: 31.5 % (ref 14.0–49.7)
MCH: 31.1 pg (ref 25.1–34.0)
MCHC: 34.3 g/dL (ref 31.5–36.0)
MCV: 90.7 fL (ref 79.5–101.0)
MONO#: 0.4 10*3/uL (ref 0.1–0.9)
MONO%: 8.4 % (ref 0.0–14.0)
NEUT%: 58.2 % (ref 38.4–76.8)
NEUTROS ABS: 2.6 10*3/uL (ref 1.5–6.5)
NRBC: 0 % (ref 0–0)
Platelets: 202 10*3/uL (ref 145–400)
RBC: 4.53 10*6/uL (ref 3.70–5.45)
RDW: 13.6 % (ref 11.2–14.5)
WBC: 4.4 10*3/uL (ref 3.9–10.3)
lymph#: 1.4 10*3/uL (ref 0.9–3.3)

## 2016-10-30 LAB — COMPREHENSIVE METABOLIC PANEL
ALT: 13 U/L (ref 0–55)
AST: 17 U/L (ref 5–34)
Albumin: 3.9 g/dL (ref 3.5–5.0)
Alkaline Phosphatase: 73 U/L (ref 40–150)
Anion Gap: 9 mEq/L (ref 3–11)
BILIRUBIN TOTAL: 0.29 mg/dL (ref 0.20–1.20)
BUN: 14.6 mg/dL (ref 7.0–26.0)
CO2: 24 meq/L (ref 22–29)
CREATININE: 0.7 mg/dL (ref 0.6–1.1)
Calcium: 10 mg/dL (ref 8.4–10.4)
Chloride: 106 mEq/L (ref 98–109)
EGFR: >90 mL/min/{1.73_m2} (ref >90)
GLUCOSE: 95 mg/dL (ref 70–140)
Potassium: 4.7 mEq/L (ref 3.5–5.1)
SODIUM: 139 meq/L (ref 136–145)
TOTAL PROTEIN: 8.1 g/dL (ref 6.4–8.3)

## 2016-10-30 MED ORDER — IOPAMIDOL (ISOVUE-300) INJECTION 61%
75.0000 mL | Freq: Once | INTRAVENOUS | Status: AC | PRN
  Administered 2016-10-30: 75 mL via INTRAVENOUS

## 2016-11-03 ENCOUNTER — Encounter: Payer: Self-pay | Admitting: Internal Medicine

## 2016-11-03 ENCOUNTER — Ambulatory Visit (HOSPITAL_BASED_OUTPATIENT_CLINIC_OR_DEPARTMENT_OTHER): Payer: BLUE CROSS/BLUE SHIELD | Admitting: Internal Medicine

## 2016-11-03 ENCOUNTER — Telehealth: Payer: Self-pay | Admitting: Internal Medicine

## 2016-11-03 VITALS — BP 106/57 | HR 83 | Temp 98.7°F | Resp 18 | Ht 66.0 in | Wt 133.1 lb

## 2016-11-03 DIAGNOSIS — D3A090 Benign carcinoid tumor of the bronchus and lung: Secondary | ICD-10-CM

## 2016-11-03 NOTE — Progress Notes (Signed)
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  PRIOR THERAPY:Status post right VATS with right middle and lower bilobectomy's with lymph node dissection under the care of Dr. Roxan Hockey on 03/18/2016  CURRENT THERAPY: Observation.  INTERVAL HISTORY: Cathy Little 41 y.o. female returns to the clinic today for six-month follow-up visit accompanied by her brother. The patient is doing fine today with no specific complaints except for mild low back pain after standing for 8 hours every day at work. She works in a factory. She denied having any chest pain but has occasional shortness of breath with exertion with no cough or hemoptysis. She denied having any fever or chills. She has no nausea or vomiting. She gained around 17 pounds since her last visit. The patient had repeat CT scan of the chest performed recently and she is here for evaluation and discussion of her scan results.  MEDICAL HISTORY: Past Medical History:  Diagnosis Date  . Constipation   . 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 Shortness of breath dyspnea     at times- "heat makes it hard to breath- :out side - breathe well.":    ALLERGIES:  is allergic to oxycodone.  MEDICATIONS:  Current Outpatient Prescriptions  Medication Sig Dispense Refill  . acetaminophen (TYLENOL) 500 MG tablet Take 2 tablets (1,000 mg total) by mouth every 6 (six) hours as needed for mild pain, fever or headache. 30 tablet 0   No current facility-administered medications for this visit.     SURGICAL HISTORY:  Past Surgical History:  Procedure Laterality Date  . OVARIAN CYST REMOVAL Left 06/30/2014  . VIDEO ASSISTED THORACOSCOPY (VATS)/ LOBECTOMY Right 03/18/2016   Procedure: VIDEO ASSISTED THORACOSCOPY (VATS)/RIGHT MIDDLE AND LOWER  BILOBECTOMY ;  Surgeon: Melrose Nakayama, MD;  Location: [facility];  Service: Thoracic;  Laterality: Right;  Marland Kitchen VIDEO BRONCHOSCOPY N/A 03/18/2016   Procedure: VIDEO BRONCHOSCOPY;  Surgeon: Melrose Nakayama, MD;  Location: [facility];  Service: Thoracic;  Laterality: N/A;  . VIDEO BRONCHOSCOPY WITH INSERTION OF INTERBRONCHIAL VALVE (IBV) N/A 04/01/2016   Procedure: VIDEO BRONCHOSCOPY WITH INSERTION OF (2) 22m and (1) 943mSPIRATION INTERBRONCHIAL VALVES (IBV);  Surgeon: StMelrose NakayamaMD;  [place] [facility] Service: Thoracic;  Laterality: N/A;  . VIDEO BRONCHOSCOPY WITH INSERTION OF INTERBRONCHIAL VALVE (IBV) N/A 06/01/2016   Procedure: VIDEO BRONCHOSCOPY WITH REMOVAL OF INTERBRONCHIAL VALVE (IBV);  Surgeon: StMelrose NakayamaMD;  [facility] [facility] Service: Thoracic;  Laterality: N/A;    REVIEW OF SYSTEMS:  A comprehensive review of systems was negative except for: Musculoskeletal: positive for back pain   PHYSICAL EXAMINATION: General appearance: alert, cooperative and no distress Head: Normocephalic, without obvious abnormality, atraumatic Neck: no adenopathy, no JVD, supple, symmetrical, trachea midline and thyroid not enlarged, symmetric, no tenderness/mass/nodules Lymph nodes: Cervical, supraclavicular, and axillary nodes normal. Resp: clear to auscultation bilaterally Back: symmetric, no curvature. ROM normal. No CVA tenderness. Cardio: regular rate and rhythm, S1, S2 normal, no murmur, click, rub or gallop GI: soft, non-tender; bowel sounds normal; no masses,  no organomegaly Extremities: extremities normal, atraumatic, no cyanosis or edema  ECOG PERFORMANCE STATUS: 0 - Asymptomatic  Blood pressure (!) 106/57, pulse 83, temperature 98.7  F (37.1 C), temperature source Oral, resp. rate 18, height '5\' 6"'$  (1.676 m), weight 133 lb 1.6 oz (60.4 kg), last menstrual period 10/12/2016, SpO2 100 %.  LABORATORY DATA: Lab  Results  Component Value Date   WBC 4.4 10/30/2016   HGB 14.1 10/30/2016   HCT 41.1 10/30/2016   MCV 90.7 10/30/2016   PLT 202 10/30/2016      Chemistry      Component Value Date/Time   NA 139 10/30/2016 0813   K 4.7 10/30/2016 0813   CL 104 05/29/2016 1453   CO2 24 10/30/2016 0813   BUN 14.6 10/30/2016 0813   CREATININE 0.7 10/30/2016 0813      Component Value Date/Time   CALCIUM 10.0 10/30/2016 0813   ALKPHOS 73 10/30/2016 0813   AST 17 10/30/2016 0813   ALT 13 10/30/2016 0813   BILITOT 0.29 10/30/2016 0813       RADIOGRAPHIC STUDIES: Ct Chest W Contrast  Result Date: 10/30/2016 CLINICAL DATA:  Followup right lung carcinoid tumor. Previous right middle and lower lobectomies. EXAM: CT CHEST WITH CONTRAST TECHNIQUE: Multidetector CT imaging of the chest was performed during intravenous contrast administration. CONTRAST:  69m ISOVUE-300 IOPAMIDOL (ISOVUE-300) INJECTION 61% COMPARISON:  02/07/2016 FINDINGS: Cardiovascular:  No acute findings. Mediastinum/Nodes: No masses or pathologically enlarged lymph nodes identified. Lungs/Pleura: Postop changes are seen from right middle and lower lobectomies. No evidence of pulmonary masses or nodules. No evidence of pulmonary infiltrate or pleural effusion. Upper Abdomen: Visualized portions of liver and adrenal glands are normal in appearance. Musculoskeletal:  No suspicious bone lesions. IMPRESSION: Postsurgical changes in right lung. No evidence of recurrent or metastatic neoplasm in the thorax. Electronically Signed   By: JEarle GellM.D.   On: 10/30/2016 11:26    ASSESSMENT AND PLAN: This is a very pleasant 41years old white female with a stage IA carcinoid tumor involving the right hilar area status post right middle and lower bilobectomies with lymph node dissection. The patient is doing fine today. Her recent CT scan of the chest showed no evidence for disease recurrence. I discussed the lab and his scan results with the patient  and her brother. I recommended for her to continue on observation with repeat CT scan of the chest in 6 months. She was advised to call immediately if she has any concerning symptoms in the interval. The patient voices understanding of current disease status and treatment options and is in agreement with the current care plan.  All questions were answered. The patient knows to call the clinic with any problems, questions or concerns. We can certainly see the patient much sooner if necessary.  I spent 10 minutes counseling the patient face to face. The total time spent in the appointment was 15 minutes.  Disclaimer: This note was dictated with voice recognition software. Similar sounding words can inadvertently be transcribed and may not be corrected upon review.

## 2016-11-03 NOTE — Telephone Encounter (Signed)
Appointments scheduled per 1/16 LOS. Patient given AVS report and calendars with future scheduled appointments.

## 2016-12-08 ENCOUNTER — Encounter: Payer: Self-pay | Admitting: Thoracic Surgery (Cardiothoracic Vascular Surgery)

## 2016-12-08 ENCOUNTER — Ambulatory Visit (INDEPENDENT_AMBULATORY_CARE_PROVIDER_SITE_OTHER): Payer: BLUE CROSS/BLUE SHIELD | Admitting: Thoracic Surgery (Cardiothoracic Vascular Surgery)

## 2016-12-08 VITALS — BP 105/62 | HR 84 | Resp 16 | Ht 66.0 in | Wt 131.6 lb

## 2016-12-08 DIAGNOSIS — Z902 Acquired absence of lung [part of]: Secondary | ICD-10-CM | POA: Diagnosis not present

## 2016-12-08 DIAGNOSIS — D3A090 Benign carcinoid tumor of the bronchus and lung: Secondary | ICD-10-CM | POA: Diagnosis not present

## 2016-12-08 NOTE — Progress Notes (Signed)
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 Nodes were negative. She had a prolonged air leak postoperatively and we eventually placed interbronchial valves. We left those in about 8 weeks and then removed them on 06/01/2016. She tolerated the procedure well and went home on the same day.  I last saw her in the office in late August 2017. She was doing well at that time with minimal pain.  In the interim since her last visit she's been doing well. She saw Dr. Julien Nordmann in January. She had a CT of the chest done at that time. She notices an occasional pain associated with her incision if she lifts something to heavy or strenuously exert yourself. Otherwise she does not have any pain. She does not have any complaints related to her breathing. Her appetite is good. She's gained weight since her last visit.  Past Medical History:  Diagnosis Date  . Constipation   . 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 Shortness of breath dyspnea     at times- "heat makes it hard to breath- :out side - breathe well.":    Current Outpatient Prescriptions  Medication Sig Dispense Refill  . acetaminophen (TYLENOL) 500 MG tablet Take 2 tablets (1,000 mg total) by mouth every 6 (six) hours as needed for mild pain, fever or headache. 30 tablet 0   No current facility-administered medications for this visit.     Physical Exam BP 105/62 (BP Location: Right Arm, Patient Position: Sitting, Cuff Size: Normal)   Pulse 84   Resp 16   Ht '5\' 6"'$  (1.676 m)   Wt 131 lb 9.6 oz (59.7 kg)   SpO2 99% Comment: ON RA  BMI 21.23 kg/m  41 year-old woman in no acute distress Alert and oriented 3 with no focal deficits No cervical or supraclavicular  adenopathy Cardiac regular rate and rhythm normal S1 and S2 Lungs absent breath sounds right base, otherwise clear Incisions well healed  Diagnostic Tests: CT CHEST WITH CONTRAST  TECHNIQUE: Multidetector CT imaging of the chest was performed during intravenous contrast administration.  CONTRAST:  28m ISOVUE-300 IOPAMIDOL (ISOVUE-300) INJECTION 61%  COMPARISON:  02/07/2016  FINDINGS: Cardiovascular:  No acute findings.  Mediastinum/Nodes: No masses or pathologically enlarged lymph nodes identified.  Lungs/Pleura: Postop changes are seen from right middle and lower lobectomies. No evidence of pulmonary masses or nodules. No evidence of pulmonary infiltrate or pleural effusion.  Upper Abdomen: Visualized portions of liver and adrenal glands are normal in appearance.  Musculoskeletal:  No suspicious bone lesions.  IMPRESSION: Postsurgical changes in right lung. No evidence of recurrent or metastatic neoplasm in the thorax.   Electronically Signed   By: JEarle GellM.D.   On: 10/30/2016 11:26 Personally reviewed the CT chest and concur with the findings noted above  Impression: Cathy Little a 41[place] cm carcin[place] tumor involving the bronchus intermedius that required a right lower and middle lobectomy about 7 months ago. She initially had a prolonged air leak, but once that resolves not had any further problems related  to her surgery.  I reviewed her CT chest. It shows no evidence of recurrent disease.  She does note an occasional pain associated with strenuous exertion or lifting a heavy object. That is not uncommon and she may always have that some degree.  Plan: Follow-up as scheduled with Dr. Quenten Raven be happy to see her back again any time in the future if I can be of any further assistance.  Cathy Nakayama, MD [facility] [phone]

## 2017-02-06 IMAGING — DX DG CHEST 1V PORT
1 series · 1 of 1 positions shown · non-contrast
Comparison: March 30, 2016.

CLINICAL DATA: Status post right lobectomy.

EXAM:
PORTABLE CHEST 1 VIEW

[chest ap]
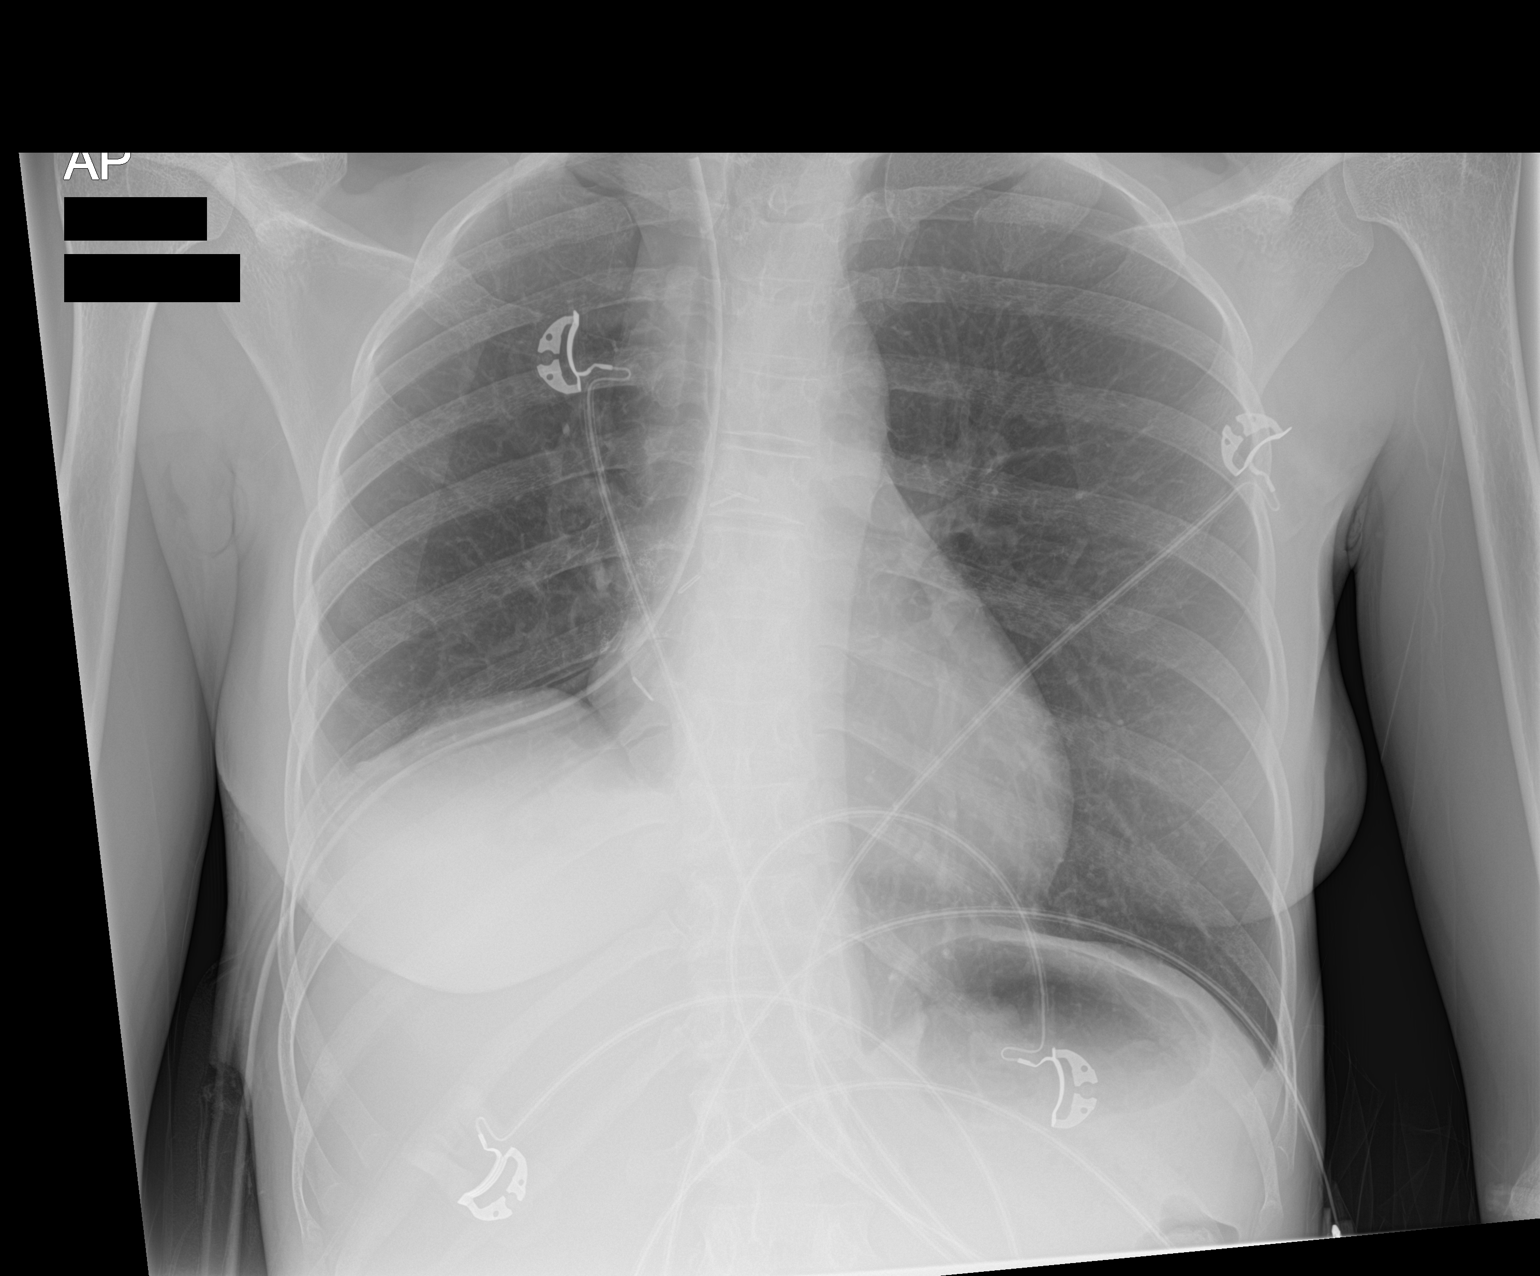

[1 of 1 positions shown; findings below may reference images not displayed]

FINDINGS: The heart size and mediastinal contours are within normal limits.
Left lung is clear. Right-sided chest tube is unchanged in position.
Mild right apical pneumothorax is noted which is improved compared
to prior exam. No consolidative process is noted. The visualized
skeletal structures are unremarkable.
IMPRESSION: Mild right apical pneumothorax is noted which is significantly
improved compared to prior exam. Right-sided chest tube is unchanged
in position.

## 2017-02-08 IMAGING — CR DG CHEST 1V PORT
1 series · 1 of 1 positions shown · non-contrast
Comparison: 04/01/2016

CLINICAL DATA: Status post right lobectomy, pneumothorax

EXAM:
PORTABLE CHEST 1 VIEW

[AP]
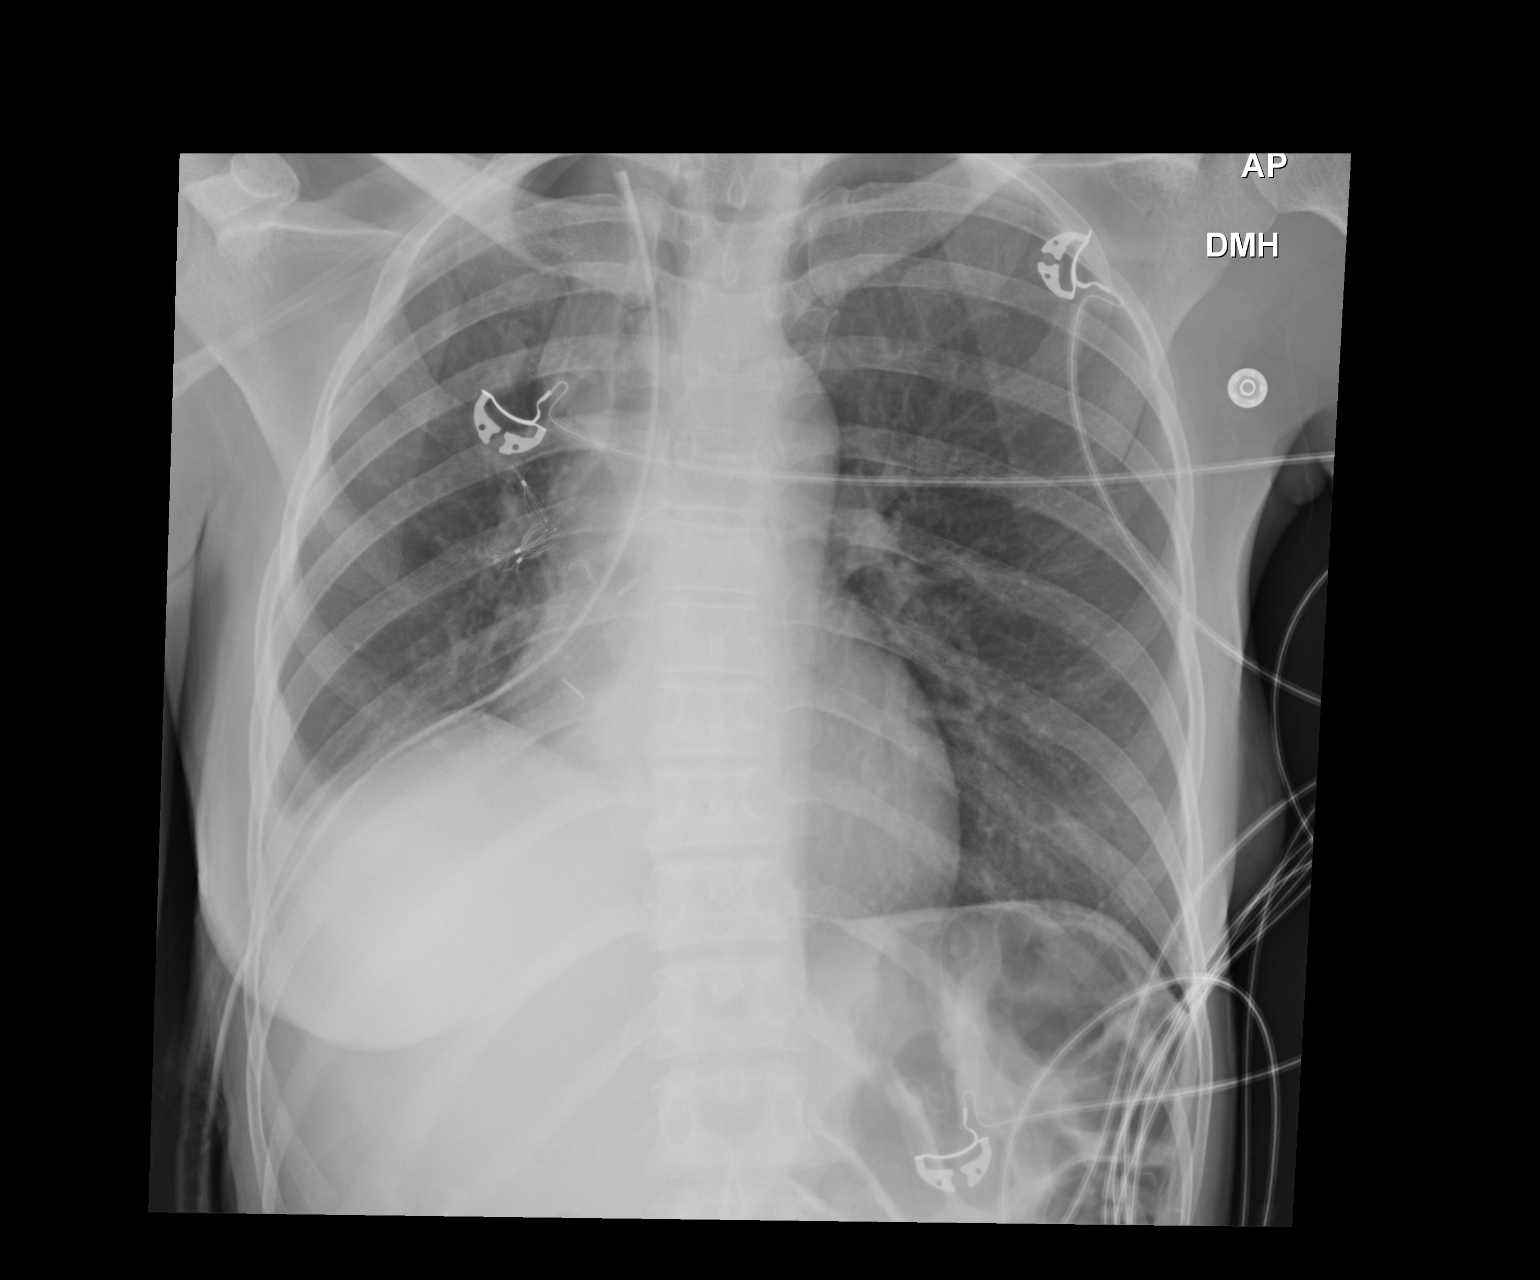

[1 of 1 positions shown; findings below may reference images not displayed]

FINDINGS: Stable right chest tube. Small right apical pneumothorax stable.
Postsurgical change over the right perihilar region similar to prior
study but with several new coil shaped devices also projecting over
the right hilum correlate clinically. Stable tiny right effusion.

Left lung is clear.
IMPRESSION: Stable small right pneumothorax

## 2017-02-09 IMAGING — CR DG CHEST 1V PORT
1 series · 1 of 1 positions shown · non-contrast
Comparison: 04/03/2016 at 7550 hours

CLINICAL DATA: Pneumothorax status post right chest removal
yesterday. Cough.

EXAM:
PORTABLE CHEST 1 VIEW

[ap]
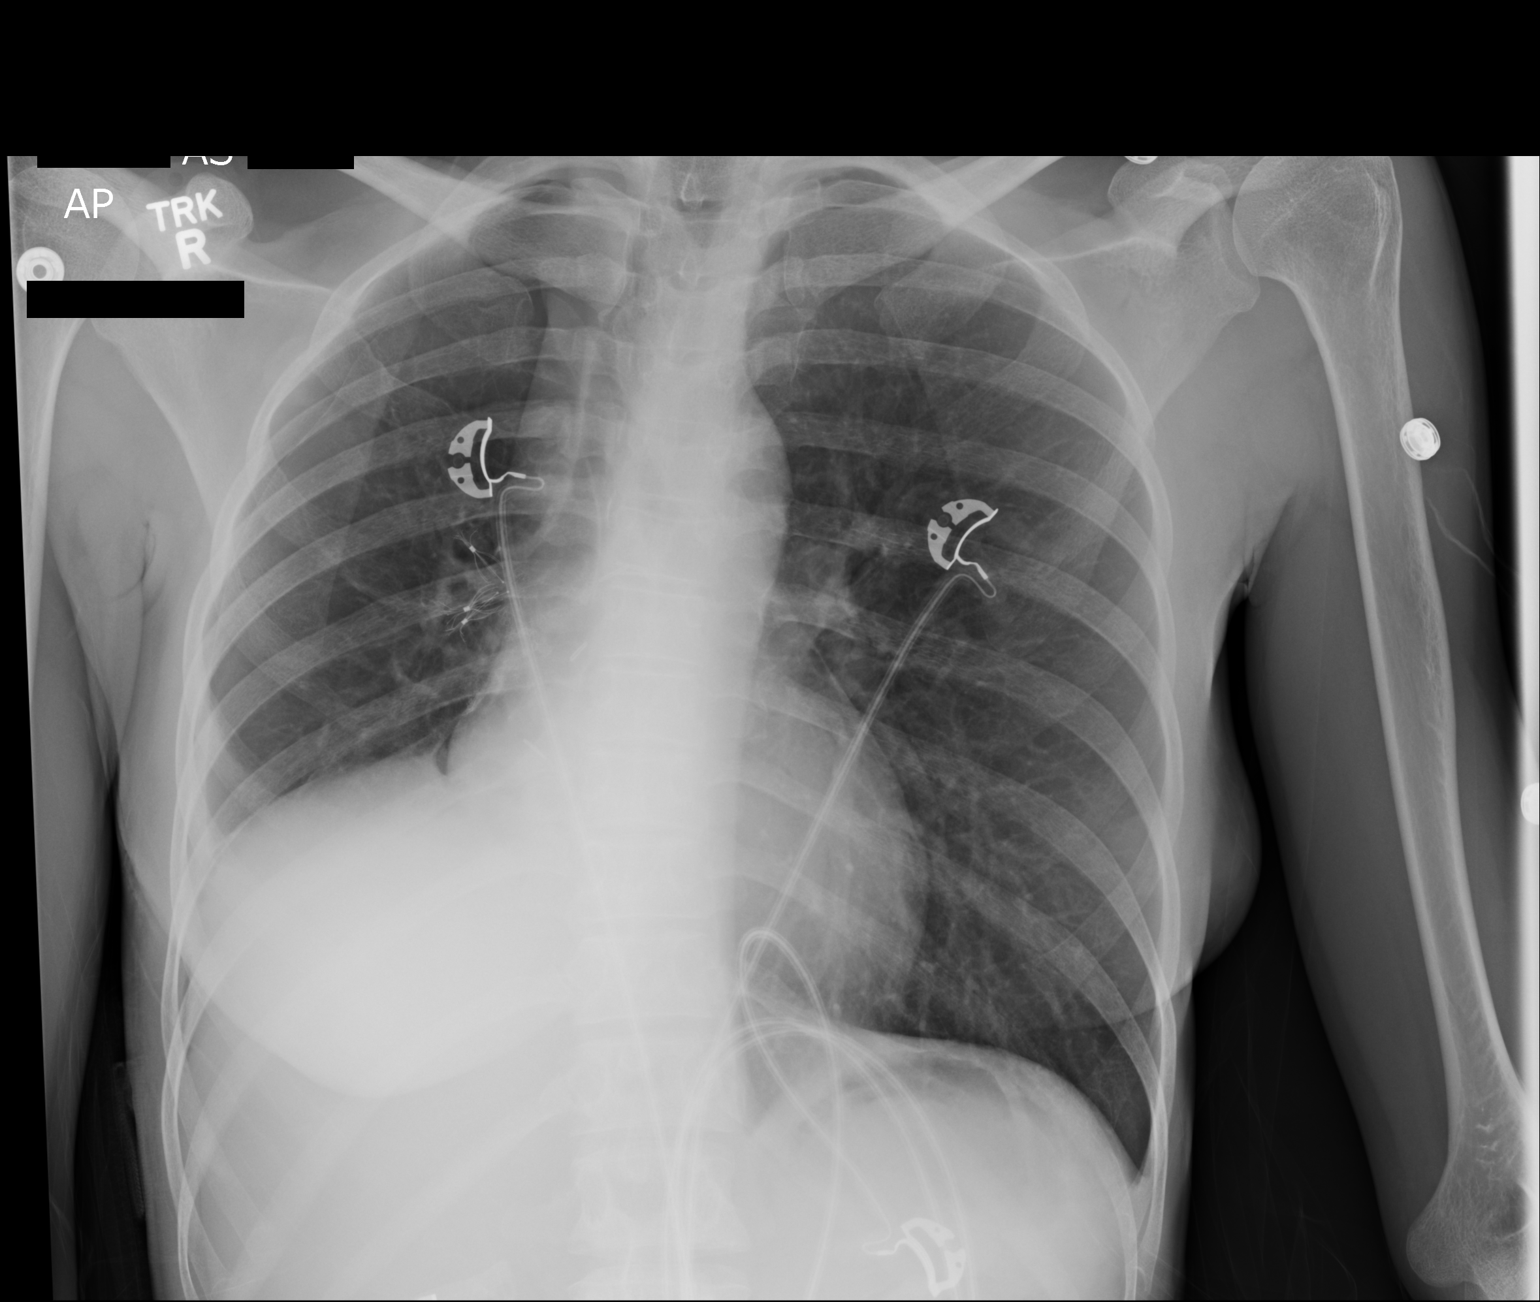

[1 of 1 positions shown; findings below may reference images not displayed]

FINDINGS: Sequelae of right middle and right lower lobectomy are again
identified with volume loss in the right hemithorax.
Cardiomediastinal silhouette is within normal limits. Bronchial
occluder devices again project over the right hilum. Right-sided
pneumothorax has mildly decreased, now small. There is improved
aeration of the right lung following some re-expansion. There is
persistent blunting of the right lateral costophrenic angle which
may reflect a small amount of pleural fluid. The left lung remains
clear.
IMPRESSION: Decreased size of right pneumothorax.  Improved right lung aeration.

## 2017-02-11 IMAGING — CR DG CHEST 2V
2 series · 2 of 2 positions shown · non-contrast
Comparison: 04/04/2016

CLINICAL DATA: Post lobectomy.

EXAM:
CHEST  2 VIEW

[chest pa]
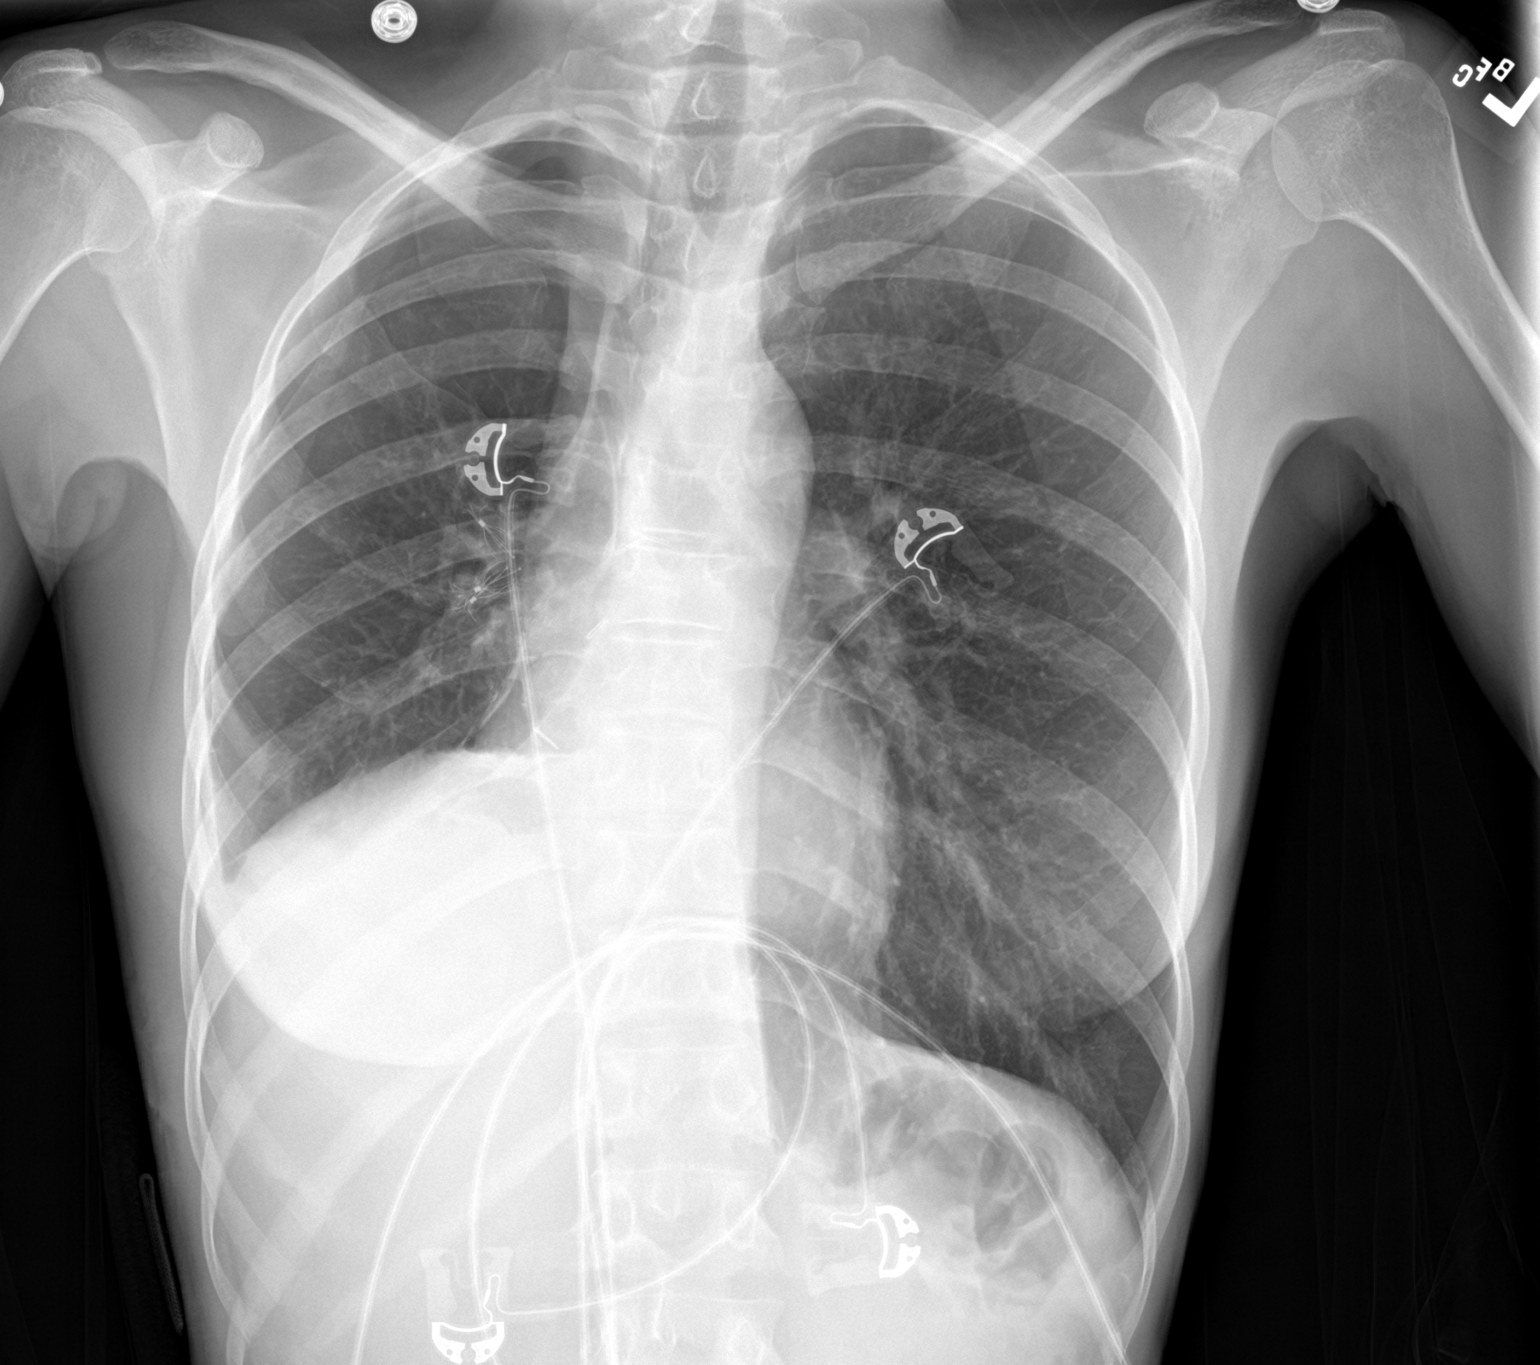

[chest lat]
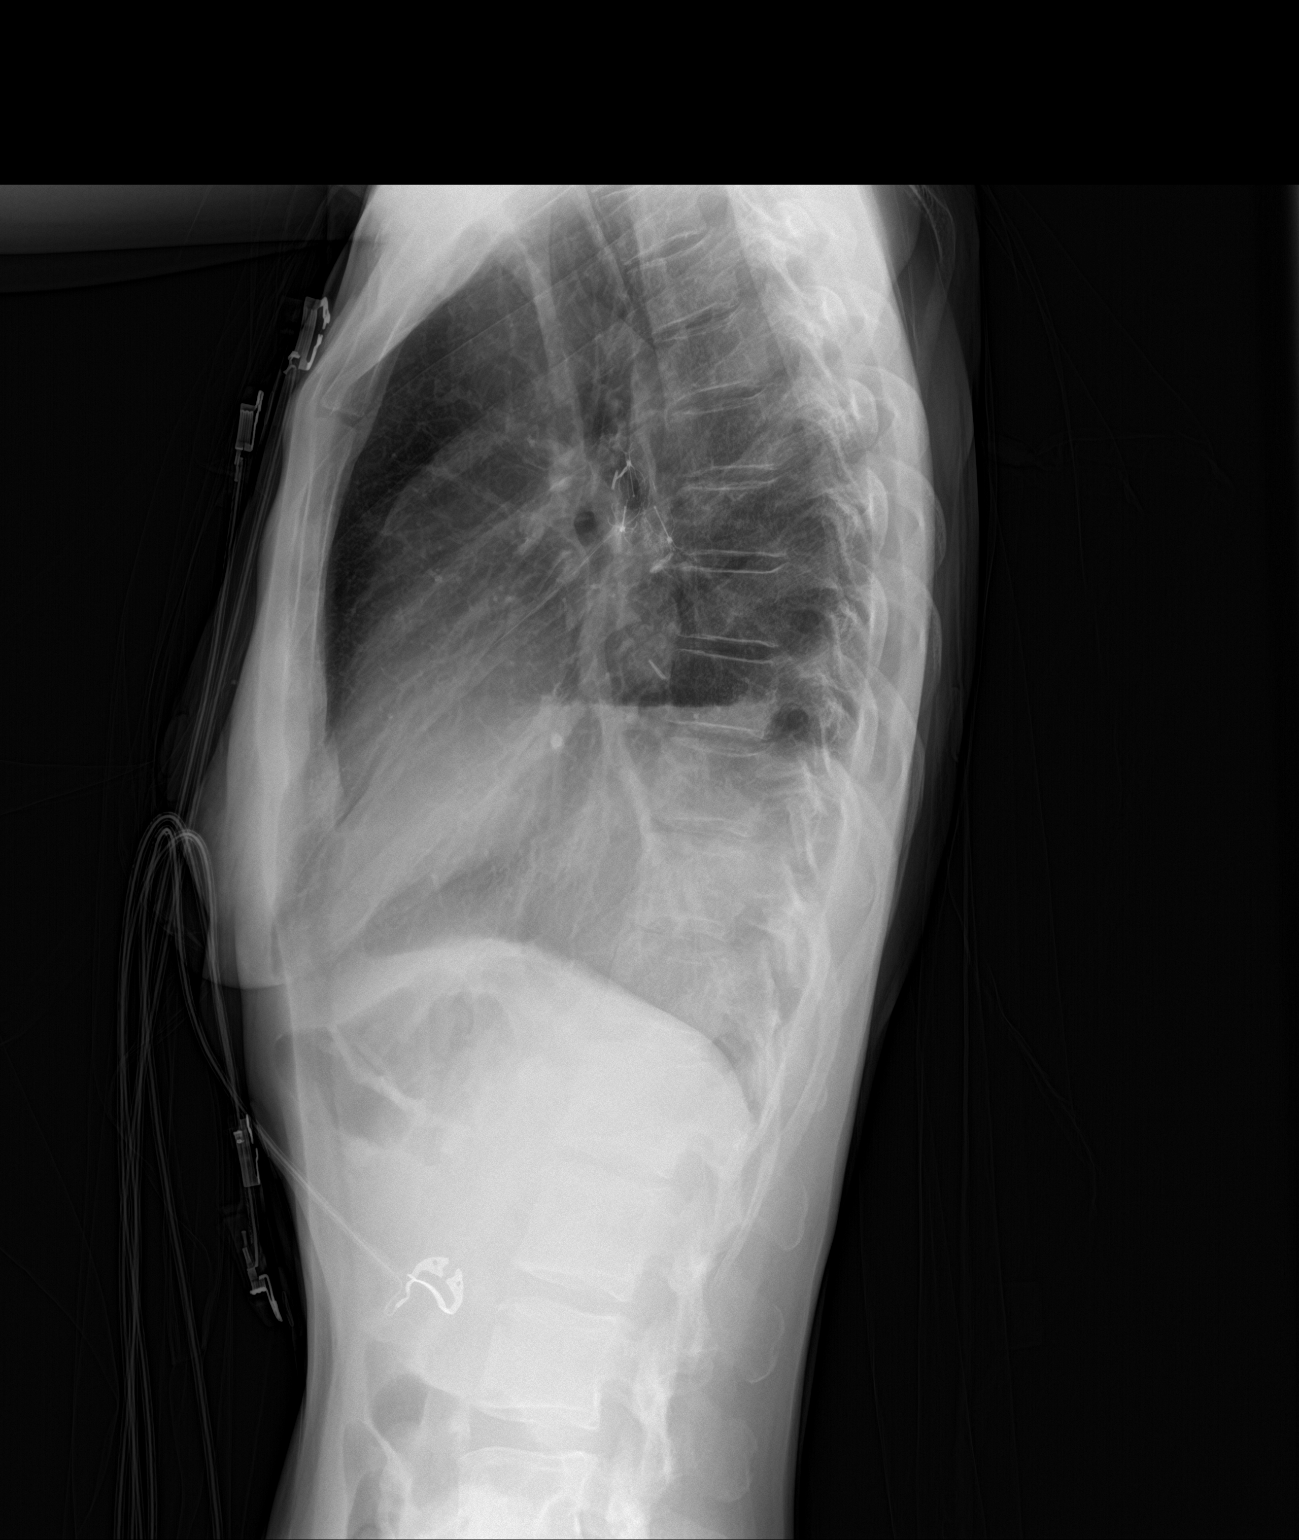

[2 of 2 positions shown; findings below may reference images not displayed]

FINDINGS: Stable postsurgical changes from right lower and middle lobectomy
with volume loss in the right hemithorax.

Cardiomediastinal silhouette is normal. Mediastinal contours appear
intact.

There is no evidence of focal airspace consolidation, or pleural
effusion. There is persistent moderate in size right apical
pneumothorax, not significantly changed from the prior radiograph.

Osseous structures are without acute abnormality. Soft tissues are
grossly normal.
IMPRESSION: Persistent moderate in size right apical pneumothorax, status post
right lobectomy.

## 2017-03-04 ENCOUNTER — Encounter: Payer: Self-pay | Admitting: Internal Medicine

## 2017-03-05 ENCOUNTER — Encounter: Payer: Self-pay | Admitting: Internal Medicine

## 2017-03-08 ENCOUNTER — Encounter: Payer: Self-pay | Admitting: Internal Medicine

## 2017-04-27 ENCOUNTER — Telehealth: Payer: Self-pay | Admitting: Internal Medicine

## 2017-04-27 NOTE — Telephone Encounter (Signed)
R/s appt 7/18 per MM request - unable to reach patient on the phone - unavailable - sent reminder letter in the mail.

## 2017-05-03 ENCOUNTER — Other Ambulatory Visit (HOSPITAL_BASED_OUTPATIENT_CLINIC_OR_DEPARTMENT_OTHER): Payer: BLUE CROSS/BLUE SHIELD

## 2017-05-03 DIAGNOSIS — D3A090 Benign carcinoid tumor of the bronchus and lung: Secondary | ICD-10-CM | POA: Diagnosis not present

## 2017-05-03 LAB — CBC WITH DIFFERENTIAL/PLATELET
BASO%: 0.6 % (ref 0.0–2.0)
Basophils Absolute: 0 10*3/uL (ref 0.0–0.1)
EOS ABS: 0.1 10*3/uL (ref 0.0–0.5)
EOS%: 2 % (ref 0.0–7.0)
HEMATOCRIT: 39.2 % (ref 34.8–46.6)
HEMOGLOBIN: 13.4 g/dL (ref 11.6–15.9)
LYMPH#: 1.5 10*3/uL (ref 0.9–3.3)
LYMPH%: 42.4 % (ref 14.0–49.7)
MCH: 32.1 pg (ref 25.1–34.0)
MCHC: 34.2 g/dL (ref 31.5–36.0)
MCV: 93.8 fL (ref 79.5–101.0)
MONO#: 0.3 10*3/uL (ref 0.1–0.9)
MONO%: 8.1 % (ref 0.0–14.0)
NEUT#: 1.7 10*3/uL (ref 1.5–6.5)
NEUT%: 46.9 % (ref 38.4–76.8)
Platelets: 198 10*3/uL (ref 145–400)
RBC: 4.18 10*6/uL (ref 3.70–5.45)
RDW: 13.8 % (ref 11.2–14.5)
WBC: 3.6 10*3/uL — AB (ref 3.9–10.3)

## 2017-05-03 LAB — COMPREHENSIVE METABOLIC PANEL
ALBUMIN: 4 g/dL (ref 3.5–5.0)
ALK PHOS: 66 U/L (ref 40–150)
ALT: 11 U/L (ref 0–55)
AST: 14 U/L (ref 5–34)
Anion Gap: 9 mEq/L (ref 3–11)
BILIRUBIN TOTAL: 0.38 mg/dL (ref 0.20–1.20)
BUN: 11.3 mg/dL (ref 7.0–26.0)
CALCIUM: 9.6 mg/dL (ref 8.4–10.4)
CO2: 23 mEq/L (ref 22–29)
Chloride: 107 mEq/L (ref 98–109)
Creatinine: 0.7 mg/dL (ref 0.6–1.1)
GLUCOSE: 93 mg/dL (ref 70–140)
POTASSIUM: 4.1 meq/L (ref 3.5–5.1)
Sodium: 139 mEq/L (ref 136–145)
TOTAL PROTEIN: 7.5 g/dL (ref 6.4–8.3)

## 2017-05-05 ENCOUNTER — Ambulatory Visit: Payer: BLUE CROSS/BLUE SHIELD | Admitting: Internal Medicine

## 2017-05-17 ENCOUNTER — Inpatient Hospital Stay (HOSPITAL_COMMUNITY)
Admission: AD | Admit: 2017-05-17 | Discharge: 2017-05-17 | Disposition: A | Discharge status: Home / Self Care | Payer: BLUE CROSS/BLUE SHIELD | Source: Ambulatory Visit | Attending: Obstetrics & Gynecology | Admitting: Obstetrics & Gynecology

## 2017-05-17 ENCOUNTER — Inpatient Hospital Stay (HOSPITAL_COMMUNITY): Payer: BLUE CROSS/BLUE SHIELD

## 2017-05-17 ENCOUNTER — Encounter (HOSPITAL_COMMUNITY): Payer: Self-pay | Admitting: *Deleted

## 2017-05-17 DIAGNOSIS — O021 Missed abortion: Secondary | ICD-10-CM | POA: Insufficient documentation

## 2017-05-17 DIAGNOSIS — Z3A01 Less than 8 weeks gestation of pregnancy: Secondary | ICD-10-CM | POA: Insufficient documentation

## 2017-05-17 DIAGNOSIS — O209 Hemorrhage in early pregnancy, unspecified: Secondary | ICD-10-CM | POA: Diagnosis present

## 2017-05-17 DIAGNOSIS — Z888 Allergy status to other drugs, medicaments and biological substances status: Secondary | ICD-10-CM | POA: Insufficient documentation

## 2017-05-17 DIAGNOSIS — Z9889 Other specified postprocedural states: Secondary | ICD-10-CM | POA: Diagnosis not present

## 2017-05-17 DIAGNOSIS — Z79899 Other long term (current) drug therapy: Secondary | ICD-10-CM | POA: Diagnosis not present

## 2017-05-17 DIAGNOSIS — N939 Abnormal uterine and vaginal bleeding, unspecified: Secondary | ICD-10-CM

## 2017-05-17 LAB — WET PREP, GENITAL
Clue Cells Wet Prep HPF POC: NONE SEEN
SPERM: NONE SEEN
TRICH WET PREP: NONE SEEN
YEAST WET PREP: NONE SEEN

## 2017-05-17 LAB — CBC
HEMATOCRIT: 40.7 % (ref 36.0–46.0)
Hemoglobin: 14.2 g/dL (ref 12.0–15.0)
MCH: 32.5 pg (ref 26.0–34.0)
MCHC: 34.9 g/dL (ref 30.0–36.0)
MCV: 93.1 fL (ref 78.0–100.0)
Platelets: 202 10*3/uL (ref 150–400)
RBC: 4.37 MIL/uL (ref 3.87–5.11)
RDW: 14.1 % (ref 11.5–15.5)
WBC: 3.8 10*3/uL — AB (ref 4.0–10.5)

## 2017-05-17 LAB — URINALYSIS, ROUTINE W REFLEX MICROSCOPIC
BILIRUBIN URINE: NEGATIVE
Glucose, UA: NEGATIVE mg/dL
Hgb urine dipstick: NEGATIVE
Ketones, ur: NEGATIVE mg/dL
LEUKOCYTES UA: NEGATIVE
NITRITE: NEGATIVE
PH: 7 (ref 5.0–8.0)
Protein, ur: NEGATIVE mg/dL
SPECIFIC GRAVITY, URINE: 1.015 (ref 1.005–1.030)

## 2017-05-17 LAB — POCT PREGNANCY, URINE: Preg Test, Ur: POSITIVE — AB

## 2017-05-17 LAB — HCG, QUANTITATIVE, PREGNANCY: hCG, Beta Chain, Quant, S: 47724 m[IU]/mL — ABNORMAL HIGH (ref <5)

## 2017-05-17 LAB — ABO/RH: ABO/RH(D): O POS

## 2017-05-17 MED ORDER — MISOPROSTOL 200 MCG PO TABS: 800.0000 ug | ORAL_TABLET | Freq: Once | ORAL | 0 refills | Status: DC

## 2017-05-17 MED ORDER — IBUPROFEN 800 MG PO TABS: 800.0000 mg | ORAL_TABLET | Freq: Three times a day (TID) | ORAL | 0 refills | Status: DC | PRN

## 2017-05-17 MED ORDER — HYDROCODONE-ACETAMINOPHEN 5-325 MG PO TABS: 1.0000 | ORAL_TABLET | ORAL | 0 refills | Status: DC | PRN

## 2017-05-17 MED ORDER — PROMETHAZINE HCL 25 MG PO TABS: 12.5000 mg | ORAL_TABLET | Freq: Four times a day (QID) | ORAL | 0 refills | Status: DC | PRN

## 2017-05-17 NOTE — MAU Note (Addendum)
Vaginal bleeding started four days ago, pt. Been spotting for last four days, not necessarily vaginal bleeding - per patient.  Pt. States she is pregnant, "I just feel it."  Pt. Has not had a pregnancy test, nor confirmed at a doctor's office.

## 2017-05-17 NOTE — MAU Provider Note (Signed)
History     CSN: [id number]  Arrival date and time: 05/17/17 0828   None     Chief Complaint  Patient presents with  . Vaginal Bleeding   Vaginal Bleeding  The patient's pertinent negatives include no genital itching, genital rash or vaginal bleeding. This is a new problem. The current episode started in the past 7 days. The problem occurs intermittently. The problem has been unchanged. The patient is experiencing no pain. She is pregnant. Pertinent negatives include no dysuria, fever, flank pain or nausea.    Pertinent Gynecological History: Menses: with minimal cramping Bleeding: loss of period Contraception: none DES exposure: unknown Blood transfusions: none Sexually transmitted diseases: no past history Previous GYN Procedures: unknown    Past Medical History:  Diagnosis Date  . Constipation   . 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 Shortness of breath dyspnea     at times- "heat makes it hard to breath- :out side - breathe well.":    Past Surgical History:  Procedure Laterality Date  . OVARIAN CYST REMOVAL Left 06/30/2014  . VIDEO ASSISTED THORACOSCOPY (VATS)/ LOBECTOMY Right 03/18/2016   Procedure: VIDEO ASSISTED THORACOSCOPY (VATS)/RIGHT MIDDLE AND LOWER  BILOBECTOMY ;  Surgeon: Melrose Nakayama, MD;  Location: [place];  Service: Thoracic;  Laterality: Right;  Marland Kitchen VIDEO BRONCHOSCOPY N/A 03/18/2016   Procedure: VIDEO BRONCHOSCOPY;  Surgeon: Melrose Nakayama, MD;  Location: [place];  Service: Thoracic;  Laterality: N/A;  . VIDEO BRONCHOSCOPY WITH INSERTION OF INTERBRONCHIAL VALVE (IBV) N/A 04/01/2016   Procedure: VIDEO BRONCHOSCOPY WITH INSERTION OF (2) 43mm and (1) 62mm SPIRATION INTERBRONCHIAL VALVES (IBV);  Surgeon: Melrose Nakayama, MD;  Location: [place];  Service: Thoracic;  Laterality: N/A;  . VIDEO BRONCHOSCOPY WITH INSERTION OF INTERBRONCHIAL VALVE (IBV) N/A 06/01/2016   Procedure: VIDEO BRONCHOSCOPY  WITH REMOVAL OF INTERBRONCHIAL VALVE (IBV);  Surgeon: Melrose Nakayama, MD;  Location: [place];  Service: Thoracic;  Laterality: N/A;    No family history on file.  Social History  Substance Use Topics  . Smoking status: Never Smoker  . Smokeless tobacco: Never Used  . Alcohol use No    Allergies:  Allergies  Allergen Reactions  . Oxycodone Nausea Only    Prescriptions Prior to Admission  Medication Sig Dispense Refill Last Dose  . acetaminophen (TYLENOL) 500 MG tablet Take 2 tablets (1,000 mg total) by mouth every 6 (six) hours as needed for mild pain, fever or headache. 30 tablet 0 Taking    Review of Systems  Constitutional: Negative for fever.  Gastrointestinal: Negative for nausea.  Genitourinary: Positive for vaginal bleeding. Negative for dysuria and flank pain.   Physical Exam   Blood pressure 101/62, pulse 72, temperature 98 F (36.7 C), temperature source Oral, resp. rate 18, SpO2 100 %.  Physical Exam  Constitutional: She appears well-developed and well-nourished.  HENT:  Head: Normocephalic and atraumatic.  Cardiovascular: Normal rate, regular rhythm and normal heart sounds.  Exam reveals no gallop and no friction rub.   No murmur heard. Respiratory: Effort normal. No accessory muscle usage. No apnea, no tachypnea and no bradypnea. No respiratory distress. She has decreased breath sounds in the right lower field.  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 Bowel sounds are normal. She exhibits no distension and no mass. There is no tenderness. There is no rebound and no guarding.   Results for orders placed or  performed during the hospital encounter of 05/17/17 (from the past 24 hour(s))  Urinalysis, Routine w reflex microscopic     Status: None   Collection Time: 05/17/17  9:00 AM  Result Value Ref Range   Color, Urine YELLOW YELLOW   APPearance CLEAR CLEAR   Specific Gravity, Urine 1.015 1.005 - 1.030   pH 7.0 5.0  - 8.0   Glucose, UA NEGATIVE NEGATIVE mg/dL   Hgb urine dipstick NEGATIVE NEGATIVE   Bilirubin Urine NEGATIVE NEGATIVE   Ketones, ur NEGATIVE NEGATIVE mg/dL   Protein, ur NEGATIVE NEGATIVE mg/dL   Nitrite NEGATIVE NEGATIVE   Leukocytes, UA NEGATIVE NEGATIVE  Pregnancy, urine POC     Status: Abnormal   Collection Time: 05/17/17  9:13 AM  Result Value Ref Range   Preg Test, Ur POSITIVE (A) NEGATIVE  Wet prep, genital     Status: Abnormal   Collection Time: 05/17/17  9:41 AM  Result Value Ref Range   Yeast Wet Prep HPF POC NONE SEEN NONE SEEN   Trich, Wet Prep NONE SEEN NONE SEEN   Clue Cells Wet Prep HPF POC NONE SEEN NONE SEEN   WBC, Wet Prep HPF POC FEW (A) NONE SEEN   Sperm NONE SEEN   CBC     Status: Abnormal   Collection Time: 05/17/17 10:09 AM  Result Value Ref Range   WBC 3.8 (L) 4.0 - 10.5 K/uL   RBC 4.37 3.87 - 5.11 MIL/uL   Hemoglobin 14.2 12.0 - 15.0 g/dL   HCT 40.7 36.0 - 46.0 %   MCV 93.1 78.0 - 100.0 fL   MCH 32.5 26.0 - 34.0 pg   MCHC 34.9 30.0 - 36.0 g/dL   RDW 14.1 11.5 - 15.5 %   Platelets 202 150 - 400 K/uL  hCG, quantitative, pregnancy     Status: Abnormal   Collection Time: 05/17/17 10:09 AM  Result Value Ref Range   hCG, Beta Chain, Quant, S 47,724 (H) <5 mIU/mL  ABO/Rh     Status: None   Collection Time: 05/17/17 10:09 AM  Result Value Ref Range   ABO/RH(D) O POS    US Ob Comp Less 14 Wks  Result Date: 05/17/2017 CLINICAL DATA:  Vaginal bleeding.  Unsure of LMP. EXAM: OBSTETRIC <14 WK [place] AND TRANSVAGINAL OB US TECHNIQUE: Both transabdominal and transvaginal ultrasound examinations were performed for complete evaluation of the gestation as well as the maternal uterus, adnexal regions, and pelvic cul-de-sac. Transvaginal technique was performed to assess early pregnancy. COMPARISON:  None. FINDINGS: Intrauterine gestational sac: Single, irregular in shape Yolk sac:  Abnormal appearance noted Embryo:  Visualized. Cardiac Activity: Not Visualized. CRL:  8   mm   6 w   5 d                  [place] EDC: 01/05/2018 Subchorionic hemorrhage:  None visualized. Maternal uterus/adnexae: Retroverted uterus. Normal appearance of right ovary. Left ovary not directly visualized, however no mass or free fluid identified. IMPRESSION: Findings meet definitive criteria for failed pregnancy. This follows SRU consensus guidelines: Diagnostic Criteria for Nonviable Pregnancy Early in the First Trimester. Alison Stalling J Med [phone]. Electronically Signed   By: Earle Gell M.D.   On: 05/17/2017 11:36   US Ob Transvaginal  Result Date: 05/17/2017 CLINICAL DATA:  Vaginal bleeding.  Unsure of LMP. EXAM: OBSTETRIC <14 WK [place] AND TRANSVAGINAL OB US TECHNIQUE: Both transabdominal and transvaginal ultrasound examinations were performed for complete evaluation of the gestation as well as  the maternal uterus, adnexal regions, and pelvic cul-de-sac. Transvaginal technique was performed to assess early pregnancy. COMPARISON:  None. FINDINGS: Intrauterine gestational sac: Single, irregular in shape Yolk sac:  Abnormal appearance noted Embryo:  Visualized. Cardiac Activity: Not Visualized. CRL:  8  mm   6 w   5 d                  [place] EDC: 01/05/2018 Subchorionic hemorrhage:  None visualized. Maternal uterus/adnexae: Retroverted uterus. Normal appearance of right ovary. Left ovary not directly visualized, however no mass or free fluid identified. IMPRESSION: Findings meet definitive criteria for failed pregnancy. This follows SRU consensus guidelines: Diagnostic Criteria for Nonviable Pregnancy Early in the First Trimester. Alison Stalling J Med [phone]. Electronically Signed   By: Earle Gell M.D.   On: 05/17/2017 11:36   MAU Course  Procedures  MDM Confirm IUP (transvaginal US) GC/chlamydia   Assessment and Plan   Confirm IUP (transvaginal US)- consistent with miscarriage (CRL> 28mm w/o cardiac activity): offer misoprostol therapy to expedite the passage  DW the patient at length regarding  US findings. Offered cytotec v expectant management. Questions answered, RBA reviewed. Patient would like to have RX for cytotec so that she can take it at a later time.   1. Missed abortion   2. Vaginal bleeding   3. [redacted] weeks gestation of pregnancy    DC home Comfort measures reviewed  1st Trimester precautions  Bleeding precautions RX: cytotec as directed, vicodin PRN, Ibuprofen PRN, phenergan PRN Return to MAU as needed FU with OB as planned  [place] [facility] Follow up.   Specialty:  Obstetrics and Gynecology Why:  They will call you for an appointment in 2 weeks.  Contact information: Marianne [place] 05/17/2017, 9:22 AM   I confirm that I have verified the information documented in the medical student's note and that I have also personally reperformed the physical exam and all medical decision making activities.   Marcille Buffy 12:22 PM 05/17/17

## 2017-05-17 NOTE — Discharge Instructions (Signed)
Cytotec (misoprostol) Prenez 4 comprims et laissez-les se [employer] votre chque. Dans 24 heures si vous n'avez pas commenc  avoir des saignements et des crampes, vous pouvez prendre 4 autres comprims. Permettez-leur de se Water quality scientist chque.  Cytotec (misoprostol) Take 4 tablets and allow them to dissolve in your check. In 24 hours if you have not started to have bleeding and cramping you may take 4 more tablets. Allow them to dissolve in your check.   Miscarriage A miscarriage is the loss of an unborn baby (fetus) before the 20th week of pregnancy. The cause is often unknown. Follow these instructions at home:  You may need to stay in bed (bed rest), or you may be able to do light activity. Go about activity as told by your doctor.  Have help at home.  Write down how many pads you use each day. Write down how soaked they are.  Do not use tampons. Do not wash out your vagina (douche) or have sex (intercourse) until your doctor approves.  Only take medicine as told by your doctor.  Do not take aspirin.  Keep all doctor visits as told.  If you or your partner have problems with grieving, talk to your doctor. You can also try counseling. Give yourself time to grieve before trying to get pregnant again. Get help right away if:  You have bad cramps or pain in your back or belly (abdomen).  You have a fever.  You pass large clumps of blood (clots) from your vagina that are walnut-sized or larger. Save the clumps for your doctor to see.  You pass large amounts of tissue from your vagina. Save the tissue for your doctor to see.  You have more bleeding.  You have thick, bad-smelling fluid (discharge) coming from the vagina.  You get lightheaded, weak, or you pass out (faint).  You have chills. This information is not intended to replace advice given to you by your health care provider. Make sure you discuss any questions you have with your health care  provider. Document Released: 12/28/2011 Document Revised: 03/12/2016 Document Reviewed: 11/05/2011 Elsevier Interactive Patient Education  2017 [place] couche Une fausse couche est la perte d'un bb  venir (ftus) avant la 20e semaine de [place]. La cause est souvent inconnue. Suivez ces instructions  la maison: Vous devrez peut-tre rester au lit (repos au lit), ou vous pourriez tre en mesure de faire une activit lgre. Allez sur l'activit comme l'a dit votre mdecin. Avoir de [employer] de serviettes que vous utilisez chaque jour. Ecrivez  quel point [employer]. N'utilisez pas de tampons. Ne pas laver votre vagin (douche) ou avoir des relations sexuelles jusqu' ce que votre mdecin approuve. Prenez seulement des [employer] indiqu par [employer]. Ne prenez pas d'aspirine. Edwyna Perfect toutes les visites Health visitor mdecin comme indiqu. Si vous ou votre partenaire avez des problmes de deuil, parlez-en  votre mdecin. Vous pouvez galement essayer de conseiller. Donnez-vous le temps de pleurer avant d'essayer de tomber [employer]. Fae Pippin de l'aide immdiatement si: Vous avez de mauvaises crampes ou des [employer] ou le ventre (abdomen). Tu as de TEFL teacher. Vous passez de grands groupes de sang (caillots) de votre vagin qui sont de la taille [place] no[place] ou plus gros. Sauvez les touffes pour que votre docteur voit. Vous passez de grandes quantits de tissus de votre vagin. Enregistrez le tissu pour [employer]. Vous  avez plus de saignement. Vous avez un liquide pais (mauvaise vacuation) provenant du vagin. Vous tes tourdi, faible, ou vous vous vanouissez. Vous avez des frissons. Cette information n'est pas destine  remplacer les conseils donns par votre fournisseur de soins de sant. Assurez-vous de [facility] questions que vous Art therapist votre fournisseur de soins de sant.

## 2017-05-17 NOTE — MAU Note (Addendum)
RN to the [facility] to provide comfort care to patient, blanket and support group information shared with patient and FOB. Pt. Verbalized disappointment and asked appropriate questions. RN reviewed medication regiment with patient, pt. And FOB verbalized understanding. Pt. Discharged with FOB at the [facility].  RN obtained signature on hard copy, e-signature not available.

## 2017-05-18 LAB — GC/CHLAMYDIA PROBE AMP (~~LOC~~) NOT AT ARMC
CHLAMYDIA, DNA PROBE: NEGATIVE
Neisseria Gonorrhea: NEGATIVE

## 2017-05-20 ENCOUNTER — Ambulatory Visit (HOSPITAL_COMMUNITY)
Admission: RE | Admit: 2017-05-20 | Discharge: 2017-05-20 | Disposition: A | Discharge status: Home / Self Care | Payer: BLUE CROSS/BLUE SHIELD | Source: Ambulatory Visit | Attending: Internal Medicine | Admitting: Internal Medicine

## 2017-05-20 DIAGNOSIS — Z902 Acquired absence of lung [part of]: Secondary | ICD-10-CM | POA: Diagnosis not present

## 2017-05-20 DIAGNOSIS — D3A090 Benign carcinoid tumor of the bronchus and lung: Secondary | ICD-10-CM | POA: Diagnosis not present

## 2017-05-20 DIAGNOSIS — R911 Solitary pulmonary nodule: Secondary | ICD-10-CM | POA: Insufficient documentation

## 2017-05-20 MED ORDER — IOPAMIDOL (ISOVUE-300) INJECTION 61%
INTRAVENOUS | Status: AC
  Filled 2017-05-20: qty 75

## 2017-05-20 MED ORDER — IOPAMIDOL (ISOVUE-300) INJECTION 61%
75.0000 mL | Freq: Once | INTRAVENOUS | Status: AC | PRN
  Administered 2017-05-20: 75 mL via INTRAVENOUS

## 2017-05-25 ENCOUNTER — Telehealth: Payer: Self-pay | Admitting: General Practice

## 2017-05-25 ENCOUNTER — Encounter: Payer: Self-pay | Admitting: Internal Medicine

## 2017-05-25 ENCOUNTER — Ambulatory Visit (HOSPITAL_BASED_OUTPATIENT_CLINIC_OR_DEPARTMENT_OTHER): Payer: BLUE CROSS/BLUE SHIELD | Admitting: Internal Medicine

## 2017-05-25 ENCOUNTER — Telehealth: Payer: Self-pay

## 2017-05-25 VITALS — BP 100/59 | HR 80 | Temp 98.5°F | Resp 19 | Ht 66.0 in | Wt 128.4 lb

## 2017-05-25 DIAGNOSIS — D3A090 Benign carcinoid tumor of the bronchus and lung: Secondary | ICD-10-CM

## 2017-05-25 NOTE — Telephone Encounter (Signed)
Called and notified patient of follow up appointment on 06/16/17 at 1:20pm.  Interpreter was used to call patient.  Patient voiced understanding.

## 2017-05-25 NOTE — Telephone Encounter (Signed)
appts made and avs printed for patient 

## 2017-05-25 NOTE — Progress Notes (Signed)
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  PRIOR THERAPY:Status post right VATS with right middle and lower bilobectomies with lymph node dissection under the care of Dr. Roxan Hockey on 03/18/2016  CURRENT THERAPY: Observation.  INTERVAL HISTORY: Cathy Little 41 y.o. female returns to the clinic today for follow-up visit accompanied by her brother. The patient is feeling fine today was no specific complaints. She has a recent miscarriage at 6 weeks. She denied having any right sided chest pain except with activity at work probably at the site of the surgical scar. She denied having any shortness of breath, cough or hemoptysis. She has no fever or chills. She denied having any significant weight loss or night sweats. She is here today for evaluation after repeating CT scan of the chest for restaging of her disease.   MEDICAL HISTORY: Past Medical History:  Diagnosis Date  . Constipation   . 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 Shortness of breath dyspnea     at times- "heat makes it hard to breath- :out side - breathe well.":    ALLERGIES:  is allergic to oxycodone.  MEDICATIONS:  Current Outpatient Prescriptions  Medication Sig Dispense Refill  . ibuprofen (ADVIL,MOTRIN) 800 MG tablet Take 1 tablet (800 mg total) by mouth every 8 (eight) hours as needed. (Patient not taking: Reported on 05/25/2017) 30 tablet 0   No current facility-administered medications for this visit.     SURGICAL HISTORY:  Past Surgical History:  Procedure Laterality Date  . OVARIAN CYST REMOVAL Left 06/30/2014  . VIDEO ASSISTED THORACOSCOPY (VATS)/ LOBECTOMY Right 03/18/2016   Procedure: VIDEO ASSISTED THORACOSCOPY (VATS)/RIGHT MIDDLE AND  LOWER  BILOBECTOMY ;  Surgeon: Melrose Nakayama, MD;  Location: [place];  Service: Thoracic;  Laterality: Right;  Marland Kitchen VIDEO BRONCHOSCOPY N/A 03/18/2016   Procedure: VIDEO BRONCHOSCOPY;  Surgeon: Melrose Nakayama, MD;  Location: [place];  Service: Thoracic;  Laterality: N/A;  . VIDEO BRONCHOSCOPY WITH INSERTION OF INTERBRONCHIAL VALVE (IBV) N/A 04/01/2016   Procedure: VIDEO BRONCHOSCOPY WITH INSERTION OF (2) 63mm and (1) 31mm SPIRATION INTERBRONCHIAL VALVES (IBV);  Surgeon: Melrose Nakayama, MD;  Location: [place];  Service: Thoracic;  Laterality: N/A;  . VIDEO BRONCHOSCOPY WITH INSERTION OF INTERBRONCHIAL VALVE (IBV) N/A 06/01/2016   Procedure: VIDEO BRONCHOSCOPY WITH REMOVAL OF INTERBRONCHIAL VALVE (IBV);  Surgeon: Melrose Nakayama, MD;  Location: [place];  Service: Thoracic;  Laterality: N/A;    REVIEW OF SYSTEMS:  A comprehensive review of systems was negative except for: Respiratory: positive for pleurisy/chest pain   PHYSICAL EXAMINATION: General appearance: alert, cooperative and no distress Head: Normocephalic, without obvious abnormality, atraumatic Neck: no adenopathy, no JVD, supple, symmetrical, trachea midline and thyroid not enlarged, symmetric, no tenderness/mass/nodules Lymph nodes: Cervical, supraclavicular, and axillary nodes normal. Resp: clear to auscultation bilaterally Back: symmetric, no curvature. ROM normal. No CVA tenderness. Cardio: regular rate and rhythm, S1, S2 normal, no murmur, click, rub or gallop GI: soft, non-tender; bowel sounds normal; no masses,  no organomegaly Extremities: extremities normal, atraumatic, no cyanosis or edema  ECOG PERFORMANCE STATUS: 0 - Asymptomatic  Blood pressure (!) 100/59, pulse 80, temperature 98.5 F (36.9 C), temperature source Oral, resp. rate 19, height 5\' 6"  (1.676  m), weight 128 lb 6.4 oz (58.2 kg), last menstrual period 10/12/2016, SpO2 100 %.  LABORATORY DATA: Lab Results  Component Value Date   WBC 3.8 (L) 05/17/2017    HGB 14.2 05/17/2017   HCT 40.7 05/17/2017   MCV 93.1 05/17/2017   PLT 202 05/17/2017      Chemistry      Component Value Date/Time   NA 139 05/03/2017 0803   K 4.1 05/03/2017 0803   CL 104 05/29/2016 1453   CO2 23 05/03/2017 0803   BUN 11.3 05/03/2017 0803   CREATININE 0.7 05/03/2017 0803      Component Value Date/Time   CALCIUM 9.6 05/03/2017 0803   ALKPHOS 66 05/03/2017 0803   AST 14 05/03/2017 0803   ALT 11 05/03/2017 0803   BILITOT 0.38 05/03/2017 0803       RADIOGRAPHIC STUDIES: Ct Chest W Contrast  Result Date: 05/20/2017 CLINICAL DATA:  Followup carcinoid tumor of the right lung. EXAM: CT CHEST WITH CONTRAST TECHNIQUE: Multidetector CT imaging of the chest was performed during intravenous contrast administration. CONTRAST:  63mL ISOVUE-300 IOPAMIDOL (ISOVUE-300) INJECTION 61% COMPARISON:  10/30/2016 FINDINGS: Cardiovascular: The heart size appears normal. No pericardial effusion. Mediastinum/Nodes: The trachea appears patent and is midline. Normal appearance of the esophagus. No enlarged mediastinal or hilar adenopathy. No axillary or supraclavicular adenopathy. There is a 9 mm right supraclavicular lymph node which is not significantly changed from previous exam, image 18 of series 2. No axillary adenopathy. Lungs/Pleura: Postoperative changes from right middle lobe and right lower lobectomy. 4 mm lung nodule is identified within the left upper lobe, image 50 of series 5. Unchanged from previous exam. Upper Abdomen: No acute abnormality. Musculoskeletal: No chest wall abnormality. No acute or significant osseous findings. IMPRESSION: 1. Stable CT of the chest. Status post right middle and right lower lobectomy. 2. Stable 4 mm left upper lobe lung nodule. Electronically Signed   By: Kerby Moors M.D.   On: 05/20/2017 17:06   US Ob Comp Less 14 Wks  Result Date: 05/17/2017 CLINICAL DATA:  Vaginal bleeding.  Unsure of LMP. EXAM: OBSTETRIC <14 WK [place] AND TRANSVAGINAL OB US  TECHNIQUE: Both transabdominal and transvaginal ultrasound examinations were performed for complete evaluation of the gestation as well as the maternal uterus, adnexal regions, and pelvic cul-de-sac. Transvaginal technique was performed to assess early pregnancy. COMPARISON:  None. FINDINGS: Intrauterine gestational sac: Single, irregular in shape Yolk sac:  Abnormal appearance noted Embryo:  Visualized. Cardiac Activity: Not Visualized. CRL:  8  mm   6 w   5 d                  [place] EDC: 01/05/2018 Subchorionic hemorrhage:  None visualized. Maternal uterus/adnexae: Retroverted uterus. Normal appearance of right ovary. Left ovary not directly visualized, however no mass or free fluid identified. IMPRESSION: Findings meet definitive criteria for failed pregnancy. This follows SRU consensus guidelines: Diagnostic Criteria for Nonviable Pregnancy Early in the First Trimester. Alison Stalling J Med [phone]. Electronically Signed   By: Earle Gell M.D.   On: 05/17/2017 11:36   US Ob Transvaginal  Result Date: 05/17/2017 CLINICAL DATA:  Vaginal bleeding.  Unsure of LMP. EXAM: OBSTETRIC <14 WK [place] AND TRANSVAGINAL OB US TECHNIQUE: Both transabdominal and transvaginal ultrasound examinations were performed for complete evaluation of the gestation as well as the maternal uterus, adnexal regions, and pelvic cul-de-sac. Transvaginal technique was performed to assess early pregnancy. COMPARISON:  None. FINDINGS: Intrauterine gestational sac: Single, irregular in shape  Yolk sac:  Abnormal appearance noted Embryo:  Visualized. Cardiac Activity: Not Visualized. CRL:  8  mm   6 w   5 d                  [place] EDC: 01/05/2018 Subchorionic hemorrhage:  None visualized. Maternal uterus/adnexae: Retroverted uterus. Normal appearance of right ovary. Left ovary not directly visualized, however no mass or free fluid identified. IMPRESSION: Findings meet definitive criteria for failed pregnancy. This follows SRU consensus guidelines:  Diagnostic Criteria for Nonviable Pregnancy Early in the First Trimester. Alison Stalling J Med [phone]. Electronically Signed   By: Earle Gell M.D.   On: 05/17/2017 11:36    ASSESSMENT AND PLAN: This is a very pleasant 41 years old white female with a stage IA carcinoid tumor involving the right hilar area status post right middle and lower bilobectomies with lymph node dissection. The patient is currently on observation and she is feeling fine. She had repeat CT scan of the chest performed recently that showed no evidence for disease recurrence. I discussed the scan results with the patient and her brother and recommended for her to continue on observation with repeat CT scan of the chest in one year. The patient was strongly advised not to do her scan if she becomes pregnant in the interval. She was also advised to call immediately if she has any concerning symptoms in the interval. The patient voices understanding of current disease status and treatment options and is in agreement with the current care plan. All questions were answered. The patient knows to call the clinic with any problems, questions or concerns. We can certainly see the patient much sooner if necessary. I spent 10 minutes counseling the patient face to face. The total time spent in the appointment was 15 minutes.  Disclaimer: This note was dictated with voice recognition software. Similar sounding words can inadvertently be transcribed and may not be corrected upon review.

## 2017-06-08 ENCOUNTER — Encounter (HOSPITAL_COMMUNITY): Payer: Self-pay | Admitting: Obstetrics and Gynecology

## 2017-06-08 ENCOUNTER — Inpatient Hospital Stay (HOSPITAL_COMMUNITY)
Admission: AD | Admit: 2017-06-08 | Discharge: 2017-06-08 | Disposition: A | Discharge status: Home / Self Care | Payer: BLUE CROSS/BLUE SHIELD | Source: Ambulatory Visit | Attending: Family Medicine | Admitting: Family Medicine

## 2017-06-08 ENCOUNTER — Inpatient Hospital Stay (HOSPITAL_COMMUNITY): Payer: BLUE CROSS/BLUE SHIELD

## 2017-06-08 DIAGNOSIS — O021 Missed abortion: Secondary | ICD-10-CM

## 2017-06-08 DIAGNOSIS — O034 Incomplete spontaneous abortion without complication: Secondary | ICD-10-CM | POA: Diagnosis present

## 2017-06-08 LAB — CBC
HCT: 32.8 % — ABNORMAL LOW (ref 36.0–46.0)
HEMOGLOBIN: 11.6 g/dL — AB (ref 12.0–15.0)
MCH: 32.6 pg (ref 26.0–34.0)
MCHC: 35.4 g/dL (ref 30.0–36.0)
MCV: 92.1 fL (ref 78.0–100.0)
PLATELETS: 180 10*3/uL (ref 150–400)
RBC: 3.56 MIL/uL — AB (ref 3.87–5.11)
RDW: 14.4 % (ref 11.5–15.5)
WBC: 3.8 10*3/uL — AB (ref 4.0–10.5)

## 2017-06-08 LAB — URINALYSIS, ROUTINE W REFLEX MICROSCOPIC
Bilirubin Urine: NEGATIVE
GLUCOSE, UA: NEGATIVE mg/dL
KETONES UR: NEGATIVE mg/dL
Nitrite: NEGATIVE
PH: 6 (ref 5.0–8.0)
PROTEIN: NEGATIVE mg/dL
Specific Gravity, Urine: 1.004 — ABNORMAL LOW (ref 1.005–1.030)

## 2017-06-08 LAB — HCG, QUANTITATIVE, PREGNANCY: hCG, Beta Chain, Quant, S: 45 m[IU]/mL — ABNORMAL HIGH (ref <5)

## 2017-06-08 MED ORDER — MISOPROSTOL 200 MCG PO TABS
600.0000 ug | ORAL_TABLET | Freq: Once | ORAL | Status: AC
  Administered 2017-06-08: 600 ug via ORAL
  Filled 2017-06-08: qty 3

## 2017-06-08 NOTE — Discharge Instructions (Signed)
Misoprostol tablets What is this medicine? MISOPROSTOL (mye soe PROST ole) helps to prevent stomach ulcers in patients who take medicines like ibuprofen and aspirin and who are at high risk of complications from ulcers. This medicine may be used for other purposes; ask your health care provider or pharmacist if you have questions. COMMON BRAND NAME(S): Cytotec What should I tell my health care provider before I take this medicine? They need to know if you have any of these conditions: -Crohn's disease -heart disease -kidney disease -ulcerative colitis -an unusual or allergic reaction to misoprostol, prostaglandins, other medicines, foods, dyes, or preservatives -pregnant or trying to get pregnant -breast-feeding How should I use this medicine? Take this medicine by mouth with a full glass of water. Follow the directions on the prescription label. Take this medicine with food.Take your medicine at regular intervals. Do not take your medicine more often than directed. Talk to your pediatrician regarding the use of this medicine in children. Special care may be needed. Overdosage: If you think you have taken too much of this medicine contact a poison control center or emergency room at once. NOTE: This medicine is only for you. Do not share this medicine with others. What if I miss a dose? If you miss a dose, take it as soon as you can. If it is almost time for your next dose, take only that dose. Do not take double or extra doses. What may interact with this medicine? -antacids This list may not describe all possible interactions. Give your health care provider a list of all the medicines, herbs, non-prescription drugs, or dietary supplements you use. Also tell them if you smoke, drink alcohol, or use illegal drugs. Some items may interact with your medicine. What should I watch for while using this medicine? Do not smoke cigarettes or drink alcohol. These increase irritation to your stomach  and can make it more susceptible to damage from medicine like ibuprofen and aspirin. If you are female, do not use this medicine if you are pregnant. Do not get pregnant while taking this medicine and for at least one month (one full menstrual cycle) after stopping this medicine. If you can become pregnant, use a reliable form of birth control while taking this medicine. Talk to your doctor about birth control options. If you do become pregnant, think you are pregnant, or want to become pregnant, immediately call your doctor for advice. What side effects may I notice from receiving this medicine? Side effects that you should report to your doctor or health care professional as soon as possible: -allergic reactions like skin rash, itching or hives, swelling of the face, lips, or tongue -chest pain -fainting spells -severe diarrhea -sudden shortness of breath -unusual vaginal bleeding, pelvic pain, or cramping Side effects that usually do not require medical attention (report to your doctor or health care professional if they continue or are bothersome): -dizziness -headache -menstrual irregularity, spotting, or cramps -mild diarrhea -nausea -stomach upset or cramps This list may not describe all possible side effects. Call your doctor for medical advice about side effects. You may report side effects to FDA at 1-800-FDA-1088. Where should I keep my medicine? Keep out of the reach of children. Store at room temperature below 25 degrees C (77 degrees F). Keep in a dry place. Protect from moisture. Throw away any unused medicine after the expiration date. NOTE: This sheet is a summary. It may not cover all possible information. If you have questions about this medicine, talk to  your doctor, pharmacist, or health care provider.  2018 Elsevier/Gold Standard (2008-09-18 10:59:53) It is very important that you arrive at 10:45 AM to have blood work drawn on 06/15/2017 in the [facility] office downstairs.  Your appointment time will be 11:00 AM.  Please do not be late and expect to wait a maximum of 2 hrs for results.  Incomplete Miscarriage A miscarriage is the sudden loss of an unborn baby (fetus) before the 20th week of pregnancy. In an incomplete miscarriage, parts of the fetus or placenta (afterbirth) remain in the body. Having a miscarriage can be an emotional experience. Talk with your health care provider about any questions you may have about miscarrying, the grieving process, and your future pregnancy plans. What are the causes?  Problems with the fetal chromosomes that make it impossible for the baby to develop normally. Problems with the baby's genes or chromosomes are most often the result of errors that occur by chance as the embryo divides and grows. The problems are not inherited from the parents.  Infection of the cervix or uterus.  Hormone problems.  Problems with the cervix, such as having an incompetent cervix. This is when the tissue in the cervix is not strong enough to hold the pregnancy.  Problems with the uterus, such as an abnormally shaped uterus, uterine fibroids, or congenital abnormalities.  Certain medical conditions.  Smoking, drinking alcohol, or taking illegal drugs.  Trauma. What are the signs or symptoms?  Vaginal bleeding or spotting, with or without cramps or pain.  Pain or cramping in the abdomen or lower back.  Passing fluid, tissue, or blood clots from the vagina. How is this diagnosed? Your health care provider will perform a physical exam. You may also have an ultrasound to confirm the miscarriage. Blood or urine tests may also be ordered. How is this treated?  Usually, a dilation and curettage (D&C) procedure is performed. During a D&C procedure, the cervix is widened (dilated) and any remaining fetal or placental tissue is gently removed from the uterus.  Antibiotic medicines are prescribed if there is an  infection. Other medicines may be given to reduce the size of the uterus (contract) if there is a lot of bleeding.  If you have Rh negative blood and your baby was Rh positive, you will need a Rho (D) immune globulin shot. This shot will protect any future baby from having Rh blood problems in future pregnancies.  You may be confined to bed rest. This means you should stay in bed and only get up to use the bathroom. Follow these instructions at home:  Rest as directed by your health care provider.  Restrict activity as directed by your health care provider. You may be allowed to continue light activity if curettage was not done but you require further treatment.  Keep track of the number of pads you use each day. Keep track of how soaked (saturated) they are. Record this information.  Do not  use tampons.  Do not douche or have sexual intercourse until approved by your health care provider.  Keep all follow-up appointments for reevaluation and continuing management.  Only take over-the-counter or prescription medicines for pain, fever, or discomfort as directed by your health care provider.  Take antibiotic medicine as directed by your health care provider. Make sure you finish it even if you start to feel better. Get help right away if:  You experience severe cramps in your stomach, back, or abdomen.  You have  an unexplained fever (make sure to record these temperatures).  You pass large clots or tissue (save these for your health care provider to inspect).  Your bleeding increases.  You become light-headed, weak, or have fainting episodes. This information is not intended to replace advice given to you by your health care provider. Make sure you discuss any questions you have with your health care provider. Document Released: 10/05/2005 Document Revised: 03/12/2016 Document Reviewed: 05/04/2013 Elsevier Interactive Patient Education  2017 [employer].

## 2017-06-08 NOTE — MAU Note (Signed)
Urine in lab 

## 2017-06-08 NOTE — MAU Note (Addendum)
Was here 3 wks ago,dx with miscarriage.  Was given a prescription.  Having pain and continues to bleed.  Has appt for the 29th.   Took the medicine on 7/31, had a lot of red bleeding that wk, the following became darker and heavier, a few days later it stopped, then has had coffee colored bleeding- small amts- off and on .

## 2017-06-08 NOTE — MAU Provider Note (Signed)
History     CSN: [id number]  Arrival date and time: 06/08/17 1417   First Provider Initiated Contact with Patient 06/08/17 1507      Chief Complaint  Patient presents with  . Abdominal Pain  . Vaginal Bleeding   HPI  Ms. Cathy Little is a 41 yo G60P2012 non-pregnant female who experienced a MAB on 7/31/and was treated with Cytotec. She presents to [facility] today with complaints of brown spotting since 8/11 and abdominal pain since 7/31.  She reports she bleeding like a period from 8/7-11 then started having brown spotting "like coffee grounds".  She states the pain is relieved by ibuprofen, but returns as soon as it wears off.  She has Percocet at home, but has not tried it.  She is concerned that something may be wrong.  She denies fatigue, weakness, dizziness, dysuria, or N/V/D.   Past Medical History:  Diagnosis Date  . Constipation   . 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 Shortness of breath dyspnea     at times- "heat makes it hard to breath- :out side - breathe well.":    Past Surgical History:  Procedure Laterality Date  . OVARIAN CYST REMOVAL Left 06/30/2014  . VIDEO ASSISTED THORACOSCOPY (VATS)/ LOBECTOMY Right 03/18/2016   Procedure: VIDEO ASSISTED THORACOSCOPY (VATS)/RIGHT MIDDLE AND LOWER  BILOBECTOMY ;  Surgeon: Melrose Nakayama, MD;  Location: [place];  Service: Thoracic;  Laterality: Right;  Marland Kitchen VIDEO BRONCHOSCOPY N/A 03/18/2016   Procedure: VIDEO BRONCHOSCOPY;  Surgeon: Melrose Nakayama, MD;  Location: [place];  Service: Thoracic;  Laterality: N/A;  . VIDEO BRONCHOSCOPY WITH INSERTION OF INTERBRONCHIAL VALVE (IBV) N/A 04/01/2016   Procedure: VIDEO BRONCHOSCOPY WITH INSERTION OF (2) 82mm and (1) 78mm SPIRATION INTERBRONCHIAL VALVES (IBV);  Surgeon: Melrose Nakayama, MD;  Location: [facility];  Service: Thoracic;  Laterality: N/A;  . VIDEO BRONCHOSCOPY WITH INSERTION OF INTERBRONCHIAL VALVE (IBV) N/A 06/01/2016   Procedure: VIDEO BRONCHOSCOPY WITH REMOVAL OF INTERBRONCHIAL VALVE (IBV);  Surgeon: Melrose Nakayama, MD;  Location: [facility];  Service: Thoracic;  Laterality: N/A;    History reviewed. No pertinent family history.  Social History  Substance Use Topics  . Smoking status: Never Smoker  . Smokeless tobacco: Never Used  . Alcohol use No    Allergies:  Allergies  Allergen Reactions  . Oxycodone Nausea Only    Prescriptions Prior to Admission  Medication Sig Dispense Refill Last Dose  . ibuprofen (ADVIL,MOTRIN) 800 MG tablet Take 1 tablet (800 mg total) by mouth every 8 (eight) hours as needed. 30 tablet 0 06/08/2017 at Unknown time    Review of Systems  Constitutional: Negative.   HENT: Negative.   Eyes: Negative.   Respiratory: Negative.   Cardiovascular: Negative.   Gastrointestinal: Positive for abdominal pain.  Endocrine: Negative.   Genitourinary: Positive for vaginal bleeding.  Musculoskeletal: Negative.   Skin: Negative.   Allergic/Immunologic: Negative.   Neurological: Negative.   Hematological: Negative.   Psychiatric/Behavioral: Negative.    Physical Exam   Blood pressure 107/67, pulse 75, temperature 98.4 F (36.9 C), temperature source Oral, resp. rate 16, weight 60.4 kg (133 lb 4 oz), last menstrual period 10/12/2016, SpO2 100 %.  Physical Exam  Constitutional: She is oriented to person, place, and time. She appears well-developed and well-nourished.  HENT:  Head: Normocephalic.  Eyes: Pupils are equal, round, and reactive to light.  Neck: Normal range of motion.  Cardiovascular:  Normal rate, regular rhythm and normal heart sounds.   Respiratory: Effort normal and breath sounds normal.  GI: Soft. Bowel sounds are normal.  Genitourinary:  Genitourinary Comments: Uterus: non-tender, cx; smooth, pink, no lesions, scant amt of dark brown blood in vaginal vault, closed/long/firm, no CMT or friability, no adnexal tenderness  Musculoskeletal: Normal range of  motion.  Neurological: She is alert and oriented to person, place, and time.  Skin: Skin is warm and dry.  Psychiatric: She has a normal mood and affect. Her behavior is normal. Judgment and thought content normal.    MAU Course  Procedures  MDM Pelvic exam CBC HCG -  Previous 47,724 on 7/31 OB transvaginal U/S Cytotec 600 mcg PO prior to d/c home  Results for orders placed or performed during the hospital encounter of 06/08/17 (from the past 24 hour(s))  Urinalysis, Routine w reflex microscopic     Status: Abnormal   Collection Time: 06/08/17  2:49 PM  Result Value Ref Range   Color, Urine STRAW (A) YELLOW   APPearance CLEAR CLEAR   Specific Gravity, Urine 1.004 (L) 1.005 - 1.030   pH 6.0 5.0 - 8.0   Glucose, UA NEGATIVE NEGATIVE mg/dL   Hgb urine dipstick MODERATE (A) NEGATIVE   Bilirubin Urine NEGATIVE NEGATIVE   Ketones, ur NEGATIVE NEGATIVE mg/dL   Protein, ur NEGATIVE NEGATIVE mg/dL   Nitrite NEGATIVE NEGATIVE   Leukocytes, UA TRACE (A) NEGATIVE   RBC / HPF 0-5 0 - 5 RBC/hpf   WBC, UA 0-5 0 - 5 WBC/hpf   Bacteria, UA RARE (A) NONE SEEN   Squamous Epithelial / LPF 0-5 (A) NONE SEEN  hCG, quantitative, pregnancy     Status: Abnormal   Collection Time: 06/08/17  4:02 PM  Result Value Ref Range   hCG, Beta Chain, Quant, S 45 (H) <5 mIU/mL  CBC     Status: Abnormal   Collection Time: 06/08/17  5:59 PM  Result Value Ref Range   WBC 3.8 (L) 4.0 - 10.5 K/uL   RBC 3.56 (L) 3.87 - 5.11 MIL/uL   Hemoglobin 11.6 (L) 12.0 - 15.0 g/dL   HCT 32.8 (L) 36.0 - 46.0 %   MCV 92.1 78.0 - 100.0 fL   MCH 32.6 26.0 - 34.0 pg   MCHC 35.4 30.0 - 36.0 g/dL   RDW 14.4 11.5 - 15.5 %   Platelets 180 150 - 400 K/uL    Assessment and Plan  Incomplete spontaneous abortion - Instructions given & discussed - Return to [facility] on 8/28 at 11:00 AM for rpt HCG - Return to MAU for worsening of sx's or no improvement. - Continue taking ibuprofen 800 mg po every 8 hrs prn pain - Take  Percocet, if pain unrelieved by ibuprofen  Discharge home Patient verbalized an understanding of the plan of care and agrees.   Laury Deep, MSN, CNM 06/08/2017, 3:28 PM

## 2017-06-15 ENCOUNTER — Ambulatory Visit: Payer: BLUE CROSS/BLUE SHIELD | Admitting: *Deleted

## 2017-06-15 DIAGNOSIS — O039 Complete or unspecified spontaneous abortion without complication: Secondary | ICD-10-CM

## 2017-06-16 ENCOUNTER — Ambulatory Visit (INDEPENDENT_AMBULATORY_CARE_PROVIDER_SITE_OTHER): Payer: BLUE CROSS/BLUE SHIELD | Admitting: Advanced Practice Midwife

## 2017-06-16 ENCOUNTER — Encounter: Payer: Self-pay | Admitting: Advanced Practice Midwife

## 2017-06-16 VITALS — BP 95/65 | HR 63 | Wt 130.3 lb

## 2017-06-16 DIAGNOSIS — O039 Complete or unspecified spontaneous abortion without complication: Secondary | ICD-10-CM | POA: Diagnosis not present

## 2017-06-16 LAB — BETA HCG QUANT (REF LAB): HCG QUANT: 15 m[IU]/mL

## 2017-06-16 NOTE — Progress Notes (Signed)
Patient here today for GYN visit. Follow up after miscarriage

## 2017-06-16 NOTE — Progress Notes (Signed)
Subjective:     Myleah Cavendish is a 41 y.o. female here for follow up visit after miscarriage.  Current complaints: light spotting continues but becoming lighter each day.  Cramping daily, improved on ibuprofen.   Gynecologic History Patient's last menstrual period was 10/12/2016. Contraception: none Last Pap: 2017. Results were: normal   Obstetric History OB History  Gravida Para Term Preterm AB Living  3 2       3   SAB TAB Ectopic Multiple Live Births          2    # Outcome Date GA Lbr Len/2nd Weight Sex Delivery Anes PTL Lv  3 Current           2 Para      Vag-Spont   LIV  1 Para      Vag-Spont   LIV       The following portions of the patient's history were reviewed and updated as appropriate: allergies, current medications, past family history, past medical history, past social history, past surgical history and problem list.  Review of Systems Pertinent items are noted in HPI.    Objective:   BP 95/65   Pulse 63   Wt 130 lb 4.8 oz (59.1 kg)   LMP 10/12/2016   BMI 21.03 kg/m    VS reviewed, nursing note reviewed,  Constitutional: well developed, well nourished, no distress HEENT: normocephalic CV: normal rate Pulm/chest wall: normal effort Abdomen: soft Neuro: alert and oriented x 3 Skin: warm, dry Psych: affect normal  Assessment:     1. SAB (spontaneous abortion) --Reviewed hcg results, drop from 45 on 8/21 to 15 on 8/28 --Follow up hcg weekly until less than 5 --Pt does not desire hormonal contraception --[place] video interpreter used for all communication   Plan:    Lab visit for hcg in 1 week

## 2017-06-16 NOTE — Patient Instructions (Signed)

## 2017-06-23 ENCOUNTER — Other Ambulatory Visit: Payer: Self-pay | Admitting: Advanced Practice Midwife

## 2017-06-23 ENCOUNTER — Other Ambulatory Visit: Payer: BLUE CROSS/BLUE SHIELD

## 2017-06-23 DIAGNOSIS — O039 Complete or unspecified spontaneous abortion without complication: Secondary | ICD-10-CM

## 2017-06-24 LAB — BETA HCG QUANT (REF LAB): hCG Quant: 6 m[IU]/mL

## 2017-06-30 ENCOUNTER — Telehealth: Payer: Self-pay

## 2017-06-30 NOTE — Telephone Encounter (Signed)
Patient's significant other called the office for the patient's Beta results from 06/23/17. Patient's beta was was 6 on 9/5. Two weeks prior, patient's beta was 15. Per Fatima Blank the patient should come in for a repeat beta. Called patient with pacific interpreter Chantal [phone]. Discussed beta results with pt and informed pt that she has an appointment for 9/17 at 10am to have her blood drawn. Patient verbalized understanding and had no questions.

## 2017-07-05 ENCOUNTER — Other Ambulatory Visit: Payer: BLUE CROSS/BLUE SHIELD

## 2017-07-05 DIAGNOSIS — O039 Complete or unspecified spontaneous abortion without complication: Secondary | ICD-10-CM

## 2017-07-06 LAB — BETA HCG QUANT (REF LAB): HCG QUANT: 3 m[IU]/mL

## 2017-07-07 ENCOUNTER — Telehealth: Payer: Self-pay

## 2017-07-07 NOTE — Telephone Encounter (Signed)
Opened in Error.

## 2017-07-07 NOTE — Progress Notes (Signed)
Pt's husband called of results of last beta draw.  I informed him that it was 3.  And per Dr. Gerarda Fraction recommendation no need for further blood draws.  Pt's husband stating understanding with no further questions.

## 2017-11-22 ENCOUNTER — Ambulatory Visit (INDEPENDENT_AMBULATORY_CARE_PROVIDER_SITE_OTHER): Payer: BLUE CROSS/BLUE SHIELD

## 2017-11-22 ENCOUNTER — Encounter (HOSPITAL_COMMUNITY): Payer: Self-pay | Admitting: Family Medicine

## 2017-11-22 ENCOUNTER — Ambulatory Visit (HOSPITAL_COMMUNITY)
Admission: EM | Admit: 2017-11-22 | Discharge: 2017-11-22 | Disposition: A | Discharge status: Home / Self Care | Payer: BLUE CROSS/BLUE SHIELD | Attending: Family Medicine | Admitting: Family Medicine

## 2017-11-22 ENCOUNTER — Other Ambulatory Visit: Payer: Self-pay

## 2017-11-22 DIAGNOSIS — T148XXA Other injury of unspecified body region, initial encounter: Secondary | ICD-10-CM | POA: Diagnosis not present

## 2017-11-22 LAB — POCT URINALYSIS DIP (DEVICE)
BILIRUBIN URINE: NEGATIVE
GLUCOSE, UA: NEGATIVE mg/dL
HGB URINE DIPSTICK: NEGATIVE
KETONES UR: NEGATIVE mg/dL
LEUKOCYTES UA: NEGATIVE
Nitrite: NEGATIVE
Protein, ur: NEGATIVE mg/dL
SPECIFIC GRAVITY, URINE: 1.02 (ref 1.005–1.030)
UROBILINOGEN UA: 0.2 mg/dL (ref 0.0–1.0)
pH: 7.5 (ref 5.0–8.0)

## 2017-11-22 MED ORDER — CYCLOBENZAPRINE HCL 5 MG PO TABS: 5.0000 mg | ORAL_TABLET | Freq: Every day | ORAL | 0 refills | Status: DC

## 2017-11-22 MED ORDER — DICLOFENAC SODIUM 75 MG PO TBEC: 75.0000 mg | DELAYED_RELEASE_TABLET | Freq: Two times a day (BID) | ORAL | 0 refills | Status: DC

## 2017-11-22 NOTE — ED Provider Notes (Signed)
[facility]   [id number] 11/22/17 Arrival Time: 1017   SUBJECTIVE:  Cathy Little is a 42 y.o. female who presents to the urgent care with complaint of R sided and back pain x1 week, unable to sleep on her R side due to pain. States yawning makes the pain worse.   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  Patient works in [employer], [employer].  About a month ago she was pushing some product back onto a shelf and felt sharp pain which has persisted.  She also likes to dance at church and has had to give that up because it hurts when she twists or bends.  About a week ago she developed some increasing pain and after that has noticed some increasing shortness of breath.  She denies any leg pain or swelling.  Patient is originally from [place]   Past Medical History:  Diagnosis Date  . Constipation   . 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 Shortness of breath dyspnea     at times- "heat makes it hard to breath- :out side - breathe well.":   No family history on file. Social History   Socioeconomic History  . Marital status: Single    Spouse name: Not on file  . Number of children: Not on file  . Years of education: Not on file  . Highest education level: Not on file  Social Needs  . Financial resource strain: Not on file  . Food insecurity - worry: Not on file  . Food insecurity - inability: Not on file  . Transportation needs - medical: Not on file  . Transportation needs - non-medical: Not on file  Occupational History  . Not on file  Tobacco Use  . Smoking status: Never Smoker  . Smokeless tobacco: Never Used  Substance and Sexual Activity  . Alcohol use: No  . Drug use: No  . Sexual activity: Not on file  Other Topics Concern  . Not on file  Social History Narrative  . Not on file   No outpatient medications have been marked as taking for the 11/22/17  encounter [facility] Encounter).   Allergies  Allergen Reactions  . Oxycodone Nausea Only      ROS: As per HPI, remainder of ROS negative.   OBJECTIVE:   Vitals:   11/22/17 1115  BP: (!) 90/57  Pulse: 64  Resp: 16  Temp: 98.4 F (36.9 C)  TempSrc: Oral  SpO2: 100%     General appearance: alert; no distress Eyes: PERRL; EOMI; conjunctiva normal HENT: normocephalic; atraumatic;; oral mucosa normal Neck: supple Lungs: clear to auscultation bilaterally Heart: regular rate and rhythm Abdomen: soft, tender RUQ; bowel sounds normal; no masses or organomegaly; no guarding or rebound tenderness Back: no CVA tenderness Extremities: no cyanosis or edema; symmetrical with no gross deformities Skin: warm and dry Neurologic: normal gait; grossly normal Psychological: alert and cooperative; normal mood and affect      Labs:  Results for orders placed or performed during the hospital encounter of 11/22/17  POCT urinalysis dip (device)  Result Value Ref Range   Glucose, UA NEGATIVE NEGATIVE mg/dL   Bilirubin Urine NEGATIVE NEGATIVE   Ketones, ur NEGATIVE NEGATIVE mg/dL   Specific Gravity, Urine 1.020 1.005 - 1.030   Hgb urine dipstick NEGATIVE NEGATIVE   pH 7.5 5.0 - 8.0   Protein, ur NEGATIVE NEGATIVE mg/dL  Urobilinogen, UA 0.2 0.0 - 1.0 mg/dL   Nitrite NEGATIVE NEGATIVE   Leukocytes, UA NEGATIVE NEGATIVE       ASSESSMENT & PLAN:  1. Muscle strain     Meds ordered this encounter  Medications  . diclofenac (VOLTAREN) 75 MG EC tablet    Sig: Take 1 tablet (75 mg total) by mouth 2 (two) times daily.    Dispense:  14 tablet    Refill:  0  . cyclobenzaprine (FLEXERIL) 5 MG tablet    Sig: Take 1 tablet (5 mg total) by mouth at bedtime.    Dispense:  7 tablet    Refill:  0    Reviewed expectations re: course of current medical issues. Questions answered. Outlined signs and symptoms indicating need for more acute intervention. Patient verbalized  understanding. After Visit Summary given.  There is been no significant change in your chest x-ray.  Based on the symptoms, your physical exam, and the unchanged x-ray, I believe you have a strain muscle.  For this reason I have prescribed an anti-inflammatory and a muscle relaxer to be taken at night to help the muscle heal.  He will probably take another couple weeks before you feel likely normal.  If your symptoms persist, please either return or see your primary care doctor.  You may need physical therapy.     Robyn Haber, MD 11/22/17 [id number]

## 2017-11-22 NOTE — ED Triage Notes (Signed)
Pt c/o R sided and back pain x1 week, unable to sleep on her R side due to pain. States yawning makes the pain worse.

## 2017-11-22 NOTE — Discharge Instructions (Signed)
There is been no significant change in your chest x-ray.  Based on the symptoms, your physical exam, and the unchanged x-ray, I believe you have a strain muscle.  For this reason I have prescribed an anti-inflammatory and a muscle relaxer to be taken at night to help the muscle heal.  He will probably take another couple weeks before you feel likely normal.  If your symptoms persist, please either return or see your primary care doctor.  You may need physical therapy.

## 2017-12-17 ENCOUNTER — Ambulatory Visit: Payer: BLUE CROSS/BLUE SHIELD | Attending: Nurse Practitioner | Admitting: Nurse Practitioner

## 2017-12-17 ENCOUNTER — Encounter: Payer: Self-pay | Admitting: Nurse Practitioner

## 2017-12-17 VITALS — BP 88/59 | HR 80 | Temp 98.7°F | Ht 66.0 in | Wt 134.0 lb

## 2017-12-17 DIAGNOSIS — G8929 Other chronic pain: Secondary | ICD-10-CM | POA: Diagnosis not present

## 2017-12-17 DIAGNOSIS — C7A090 Malignant carcinoid tumor of the bronchus and lung: Secondary | ICD-10-CM | POA: Diagnosis not present

## 2017-12-17 DIAGNOSIS — R0602 Shortness of breath: Secondary | ICD-10-CM | POA: Diagnosis not present

## 2017-12-17 DIAGNOSIS — M546 Pain in thoracic spine: Secondary | ICD-10-CM | POA: Insufficient documentation

## 2017-12-17 DIAGNOSIS — Z885 Allergy status to narcotic agent status: Secondary | ICD-10-CM | POA: Insufficient documentation

## 2017-12-17 MED ORDER — MELOXICAM 7.5 MG PO TABS: 7.5000 mg | ORAL_TABLET | Freq: Every day | ORAL | 1 refills | Status: DC

## 2017-12-17 MED ORDER — CYCLOBENZAPRINE HCL 5 MG PO TABS: 5.0000 mg | ORAL_TABLET | Freq: Every day | ORAL | 1 refills | Status: DC

## 2017-12-17 NOTE — Patient Instructions (Addendum)
Thoracotomy Thoracotomy is surgery to open the chest to get access to the organs and tissue inside. This type of surgery is often used to repair or treat the lungs, heart, or arteries, or to remove tissue. Tell a health care provider about:  Any allergies you have.  All medicines you are taking, including vitamins, herbs, eye drops, creams, and over-the-counter medicines.  Any problems you or family members have had with anesthetic medicines.  Any blood disorders you have.  Any surgeries you have had.  Any medical conditions you have.  Whether you are pregnant or may be pregnant. What are the risks? Generally, this is a safe procedure. However, problems may occur, including:  Infection.  Severe bleeding (hemorrhage).  Allergic reaction to medicines.  Damage to other structures or organs, such as nerves.  Abnormal heart rhythm (arrhythmia).  Respiratory failure. This is when not enough oxygen passes from the lungs into the blood. In the case of this procedure, this is most often caused by lung collapse.  What happens before the procedure? Medicines  Ask your health care provider about: ? Changing or stopping your regular medicines. This is especially important if you are taking diabetes medicines or blood thinners. ? Taking medicines such as aspirin and ibuprofen. These medicines can thin your blood. Do not take these medicines before your procedure if your health care provider instructs you not to.  You may be given antibiotic medicine to help prevent infection. Staying hydrated Follow instructions from your health care provider about hydration, which may include:  Up to 2 hours before the procedure - you may continue to drink clear liquids, such as water, clear fruit juice, black coffee, and plain tea.  Eating and drinking restrictions Follow instructions from your health care provider about eating and drinking, which may include:  8 hours before the procedure - stop  eating heavy meals or foods such as meat, fried foods, or fatty foods.  6 hours before the procedure - stop eating light meals or foods, such as toast or cereal.  6 hours before the procedure - stop drinking milk or drinks that contain milk.  2 hours before the procedure - stop drinking clear liquids.  General instructions  You may have tests, such as: ? X-rays. ? MRI. ? CT scan. ? Tests of mucus from your lungs (sputum culture). ? Blood tests. ? Lung (pulmonary) function tests. ? A test of heart function and rhythm (electrocardiogram, ECG). ? A test to evaluate the blood vessels in your lungs (pulmonary angiogram).  You may be asked to shower with a germ-killing soap.  Ask your health care provider how your surgical site will be marked or identified.  Plan to have someone take you home from the hospital.  Do not use any products that contain nicotine or tobacco, such as cigarettes and e-cigarettes. If you need help quitting, ask your health care provider. What happens during the procedure?  To lower your risk of infection: ? Your health care team will wash or sanitize their hands. ? Your skin will be washed with soap.  An IV tube will be inserted into one of your veins.  You will be given a medicine to make you fall asleep (general anesthetic). You may also be given a medicine to help you relax (sedative).  A thin tube (catheter) will be inserted into your urethra and bladder to drain your urine.  A tube will be placed down your throat to help you breathe.  A 5-10 inch (13-25 cm)  incision will be made in your chest. The size and exact location of the incision varies depending on the purpose of the procedure.  A tool (retractor) will be used to separate muscle, tissue, and in some cases, your rib cage. This is done to create easier access to organs and tissue.  Any damaged or diseased tissue inside of your chest will be removed.  A chest tube will be inserted between  your ribs to drain fluid. This helps to prevent fluid buildup in your lungs.  Your incision will be closed with stitches (sutures) or staples.  A bandage (dressing) may be placed over the incision. The procedure may vary among health care providers and hospitals. What happens after the procedure?  Your blood pressure, heart rate, breathing rate, and blood oxygen level will be monitored.  You will be given pain medicine as needed.  You will continue to have a chest tube draining fluid from your lungs for 24-48 hours. You will be monitored closely for signs of fluid buildup in your lungs.  You may continue: ? To have a breathing tube. ? To receive fluids and medicines through an IV tube. ? To have a catheter draining your urine.  You may have to wear compression stockings. These stockings help to prevent blood clots and reduce swelling in your legs.  You may be shown how to do breathing exercises and how to use a tool that measures how well you are filling your lungs with each breath (incentive spirometer). These can help prevent pneumonia. Summary  Thoracotomy is surgery to open the chest to get access to the organs and tissue inside.  This type of surgery is often used to repair or treat the lungs, heart, or arteries, or to remove tissue.  During the procedure, a 5-10 inch (13-25 cm) incision will be made in your chest. The size and exact location of the incision varies depending on the purpose of the procedure.  After this procedure, you will continue to have a chest tube draining fluid from your lungs for 24-48 hours. You will be monitored closely for signs of fluid buildup in your lungs. This information is not intended to replace advice given to you by your health care provider. Make sure you discuss any questions you have with your health care provider. Document Released: 07/04/2003 Document Revised: 06/29/2016 Document Reviewed: 06/29/2016 Elsevier Interactive Patient Education   2017 [employer].

## 2017-12-17 NOTE — Progress Notes (Signed)
Assessment & Plan:  Cathy Little was seen today for establish care.  Diagnoses and all orders for this visit:  Chronic right-sided thoracic back pain -     cyclobenzaprine (FLEXERIL) 5 MG tablet; Take 1 tablet (5 mg total) by mouth at bedtime. -     meloxicam (MOBIC) 7.5 MG tablet; Take 1 tablet (7.5 mg total) by mouth daily. She declines back brace  Shortness of breath -     cyclobenzaprine (FLEXERIL) 5 MG tablet; Take 1 tablet (5 mg total) by mouth at bedtime.    Patient has been counseled on age-appropriate routine health concerns for screening and prevention. These are reviewed and up-to-date. Referrals have been placed accordingly. Immunizations are up-to-date or declined.    Subjective:   Chief Complaint  Patient presents with  . Establish Care    Patient is here to establish care. Patient stated she sometimes have soreness tender pain on the right side of her lungs when she talk loud it hurts. Patient stated she had lung surgery 2017.     HPI Cathy Little 42 y.o. female presents to office today to establish care.  She is accompanied by her brother today.  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 S/P Right VATS with RML and RLL bilobectomies with lymph node dissection 03-18-2016.  She has a follow-up appointment with oncology on May 23, 2018.  Today she endorses chronic shortness of breath when she speaks for long periods of time and right sided pain with strenuous lifting, pushing or pulling of heavy objects. This has been ongoing for over a year since her surgery.  She has had to give up praise dancing at her church due to right-sided pain with twisting or bending.  She was seen in the emergency room on 11/22/2017 with the same symptoms and was prescribed Flexeril and diclofenac which she reports provided significant relief of her symptoms.  She would like a refill on these medications.  Her x-ray at that time was  normal and unchanged from previous x-ray.   She is also requesting a letter today stating she is unable to lift heavy objects on her job due to pain and shortness of breath.  She works at [employer] on the [employer].  She currently denies any chest pain, shortness of breath, or hemoptysis.   Review of Systems  Constitutional: Positive for malaise/fatigue. Negative for fever and weight loss.  HENT: Negative.   Eyes: Negative.  Negative for blurred vision, double vision and photophobia.  Respiratory: Positive for shortness of breath. Negative for cough, hemoptysis and wheezing.        SEE HPI  Cardiovascular: Negative.  Negative for chest pain, palpitations, orthopnea, leg swelling and PND.  Gastrointestinal: Negative.  Negative for heartburn, nausea and vomiting.  Musculoskeletal: Positive for back pain and myalgias.       SEE HPI  Neurological: Negative.  Negative for dizziness, focal weakness, seizures and headaches.  Psychiatric/Behavioral: Negative.  Negative for suicidal ideas.    Past Medical History:  Diagnosis Date  . Constipation   . 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 Shortness of breath dyspnea     at times- "heat makes it hard to breath- :out side - breathe well.":    Past Surgical History:  Procedure Laterality Date  . OVARIAN CYST REMOVAL Left 06/30/2014  . VIDEO ASSISTED THORACOSCOPY (  VATS)/ LOBECTOMY Right 03/18/2016   Procedure: VIDEO ASSISTED THORACOSCOPY (VATS)/RIGHT MIDDLE AND LOWER  BILOBECTOMY ;  Surgeon: Melrose Nakayama, MD;  Location: [place];  Service: Thoracic;  Laterality: Right;  Marland Kitchen VIDEO BRONCHOSCOPY N/A 03/18/2016   Procedure: VIDEO BRONCHOSCOPY;  Surgeon: Melrose Nakayama, MD;  Location: [facility];  Service: Thoracic;  Laterality: N/A;  . VIDEO BRONCHOSCOPY WITH INSERTION OF INTERBRONCHIAL VALVE (IBV) N/A 04/01/2016   Procedure: VIDEO BRONCHOSCOPY WITH INSERTION OF (2) 38mm and (1)  87mm SPIRATION INTERBRONCHIAL VALVES (IBV);  Surgeon: Melrose Nakayama, MD;  Location: [place];  Service: Thoracic;  Laterality: N/A;  . VIDEO BRONCHOSCOPY WITH INSERTION OF INTERBRONCHIAL VALVE (IBV) N/A 06/01/2016   Procedure: VIDEO BRONCHOSCOPY WITH REMOVAL OF INTERBRONCHIAL VALVE (IBV);  Surgeon: Melrose Nakayama, MD;  Location: [place];  Service: Thoracic;  Laterality: N/A;    History reviewed. No pertinent family history.  Social History Reviewed with no changes to be made today.   Outpatient Medications Prior to Visit  Medication Sig Dispense Refill  . Acetaminophen (TYLENOL PO) Take by mouth.    . cyclobenzaprine (FLEXERIL) 5 MG tablet Take 1 tablet (5 mg total) by mouth at bedtime. (Patient not taking: Reported on 12/17/2017) 7 tablet 0  . diclofenac (VOLTAREN) 75 MG EC tablet Take 1 tablet (75 mg total) by mouth 2 (two) times daily. (Patient not taking: Reported on 12/17/2017) 14 tablet 0   No facility-administered medications prior to visit.     Allergies  Allergen Reactions  . Oxycodone Nausea Only       Objective:    BP (!) 88/59 (BP Location: Left Arm, Patient Position: Sitting, Cuff Size: Normal)   Pulse 80   Temp 98.7 F (37.1 C) (Oral)   Ht 5\' 6"  (1.676 m)   Wt 134 lb (60.8 kg)   LMP 10/03/2016   SpO2 99%   BMI 21.63 kg/m  Wt Readings from Last 3 Encounters:  12/17/17 134 lb (60.8 kg)  06/16/17 130 lb 4.8 oz (59.1 kg)  06/08/17 133 lb 4 oz (60.4 kg)    Physical Exam  Constitutional: She is oriented to person, place, and time. She appears well-developed and well-nourished. She is cooperative.  HENT:  Head: Normocephalic and atraumatic.  Eyes: EOM are normal.  Neck: Normal range of motion.  Cardiovascular: Normal rate, regular rhythm, normal heart sounds and intact distal pulses. Exam reveals no gallop and no friction rub.  No murmur heard. Pulmonary/Chest: Effort normal and breath sounds normal. No tachypnea. No respiratory distress. She has no  decreased breath sounds. She has no wheezes. She has no rhonchi. She has no rales. She exhibits no tenderness.  Abdominal: Soft. Bowel sounds are normal.  Musculoskeletal: Normal range of motion. She exhibits no edema.       Arms: Neurological: She is alert and oriented to person, place, and time. Coordination normal.  Skin: Skin is warm and dry.  Psychiatric: She has a normal mood and affect. Her behavior is normal. Judgment and thought content normal.  Nursing note and vitals reviewed.      Patient has been counseled extensively about nutrition and exercise as well as the importance of adherence with medications and regular follow-up. The patient was given clear instructions to go to ER or return to medical center if symptoms don't improve, worsen or new problems develop. The patient verbalized understanding.   Follow-up: Return in about 4 weeks (around 01/14/2018) for right sided pain.   Gildardo Pounds, FNP-BC [facility] [place], [place]   12/17/2017, 3:21 PM

## 2018-01-14 ENCOUNTER — Ambulatory Visit: Payer: BLUE CROSS/BLUE SHIELD | Admitting: Nurse Practitioner

## 2018-02-21 ENCOUNTER — Ambulatory Visit: Payer: BLUE CROSS/BLUE SHIELD | Admitting: Nurse Practitioner

## 2018-05-17 ENCOUNTER — Telehealth: Payer: Self-pay | Admitting: *Deleted

## 2018-05-17 NOTE — Telephone Encounter (Signed)
Oncology Nurse Navigator Documentation  Oncology Nurse Navigator Flowsheets 05/17/2018  Navigator Location [facility]  Navigator Encounter Type Telephone/I followed up on Ms. Lucien schedule for her CT chest. This has not been scheduled.  I called her today to update but was unable to reach her. I did leave vm message with name and phone number to call.   Telephone Outgoing Call  Treatment Phase Follow-up  Barriers/Navigation Needs Coordination of Care  Interventions Coordination of Care  Coordination of Care Other  Acuity Level 1  Time Spent with Patient 15

## 2018-05-18 ENCOUNTER — Telehealth: Payer: Self-pay | Admitting: *Deleted

## 2018-05-18 NOTE — Telephone Encounter (Signed)
Oncology Nurse Navigator Documentation  Oncology Nurse Navigator Flowsheets 05/18/2018  Navigator Location [facility]  Navigator Encounter Type Telephone/I called today to contact Cathy Little about her ct scan.  I was unable to reach but did leave vm message to call with my phone number.   Telephone Outgoing Call  Treatment Phase Follow-up  Barriers/Navigation Needs Coordination of Care  Interventions Coordination of Care  Coordination of Care Other  Acuity Level 1  Time Spent with Patient 15

## 2018-05-20 ENCOUNTER — Inpatient Hospital Stay: Payer: BLUE CROSS/BLUE SHIELD | Attending: Internal Medicine

## 2018-05-20 DIAGNOSIS — C7A090 Malignant carcinoid tumor of the bronchus and lung: Secondary | ICD-10-CM | POA: Insufficient documentation

## 2018-05-20 DIAGNOSIS — D3A090 Benign carcinoid tumor of the bronchus and lung: Secondary | ICD-10-CM

## 2018-05-20 LAB — COMPREHENSIVE METABOLIC PANEL
ALBUMIN: 4.3 g/dL (ref 3.5–5.0)
ALK PHOS: 83 U/L (ref 38–126)
ALT: 13 U/L (ref 0–44)
AST: 16 U/L (ref 15–41)
Anion gap: 9 (ref 5–15)
BILIRUBIN TOTAL: 0.5 mg/dL (ref 0.3–1.2)
BUN: 13 mg/dL (ref 6–20)
CO2: 26 mmol/L (ref 22–32)
CREATININE: 0.81 mg/dL (ref 0.44–1.00)
Calcium: 9.8 mg/dL (ref 8.9–10.3)
Chloride: 105 mmol/L (ref 98–111)
GFR calc Af Amer: >60 mL/min (ref >60)
GLUCOSE: 84 mg/dL (ref 70–99)
Potassium: 4 mmol/L (ref 3.5–5.1)
Sodium: 140 mmol/L (ref 135–145)
TOTAL PROTEIN: 8 g/dL (ref 6.5–8.1)

## 2018-05-20 LAB — CBC WITH DIFFERENTIAL/PLATELET
BASOS ABS: 0 10*3/uL (ref 0.0–0.1)
Basophils Relative: 1 %
EOS ABS: 0.1 10*3/uL (ref 0.0–0.5)
EOS PCT: 3 %
HCT: 39.9 % (ref 34.8–46.6)
Hemoglobin: 13.4 g/dL (ref 11.6–15.9)
LYMPHS PCT: 43 %
Lymphs Abs: 1.4 10*3/uL (ref 0.9–3.3)
MCH: 32 pg (ref 25.1–34.0)
MCHC: 33.5 g/dL (ref 31.5–36.0)
MCV: 95.6 fL (ref 79.5–101.0)
Monocytes Absolute: 0.4 10*3/uL (ref 0.1–0.9)
Monocytes Relative: 12 %
Neutro Abs: 1.3 10*3/uL — ABNORMAL LOW (ref 1.5–6.5)
Neutrophils Relative %: 41 %
PLATELETS: 187 10*3/uL (ref 145–400)
RBC: 4.18 MIL/uL (ref 3.70–5.45)
RDW: 13.7 % (ref 11.2–14.5)
WBC: 3.3 10*3/uL — ABNORMAL LOW (ref 3.9–10.3)

## 2018-05-23 ENCOUNTER — Telehealth: Payer: Self-pay | Admitting: Internal Medicine

## 2018-05-23 ENCOUNTER — Inpatient Hospital Stay (HOSPITAL_BASED_OUTPATIENT_CLINIC_OR_DEPARTMENT_OTHER): Payer: BLUE CROSS/BLUE SHIELD | Admitting: Internal Medicine

## 2018-05-23 ENCOUNTER — Encounter: Payer: Self-pay | Admitting: Internal Medicine

## 2018-05-23 VITALS — BP 110/75 | HR 79 | Temp 98.9°F | Resp 18 | Ht 66.0 in | Wt 137.0 lb

## 2018-05-23 DIAGNOSIS — C7A090 Malignant carcinoid tumor of the bronchus and lung: Secondary | ICD-10-CM

## 2018-05-23 NOTE — Progress Notes (Signed)
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  PRIOR THERAPY:Status post right VATS with right middle and lower bilobectomies with lymph node dissection under the care of Dr. Roxan Hockey on 03/18/2016  CURRENT THERAPY: Observation.  INTERVAL HISTORY: Cathy Little 42 y.o. female returns to the clinic today for annual follow-up visit accompanied by her brother.  The patient is feeling fine today with no specific complaints.  She denied having any chest pain, shortness of breath, cough or hemoptysis.  She denied having any recent weight loss or night sweats.  She has no nausea, vomiting, diarrhea or constipation.  She was supposed to have repeat CT scan of the chest before this visit but unfortunately there was miscommunication regarding scheduling her appointment.  She is here today for evaluation and repeat blood work.  MEDICAL HISTORY: Past Medical History:  Diagnosis Date  . Constipation   . 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 Shortness of breath dyspnea     at times- "heat makes it hard to breath- :out side - breathe well.":    ALLERGIES:  is allergic to oxycodone.  MEDICATIONS:  Current Outpatient Medications  Medication Sig Dispense Refill  . Acetaminophen (TYLENOL PO) Take by mouth.     No current facility-administered medications for this visit.     SURGICAL HISTORY:  Past Surgical History:  Procedure Laterality Date  . OVARIAN CYST REMOVAL Left 06/30/2014  . VIDEO ASSISTED THORACOSCOPY (VATS)/ LOBECTOMY Right 03/18/2016   Procedure: VIDEO ASSISTED THORACOSCOPY (VATS)/RIGHT MIDDLE AND LOWER  BILOBECTOMY ;  Surgeon: Melrose Nakayama, MD;  Location: [facility];  Service: Thoracic;  Laterality:  Right;  Marland Kitchen VIDEO BRONCHOSCOPY N/A 03/18/2016   Procedure: VIDEO BRONCHOSCOPY;  Surgeon: Melrose Nakayama, MD;  Location: [facility];  Service: Thoracic;  Laterality: N/A;  . VIDEO BRONCHOSCOPY WITH INSERTION OF INTERBRONCHIAL VALVE (IBV) N/A 04/01/2016   Procedure: VIDEO BRONCHOSCOPY WITH INSERTION OF (2) 54mm and (1) 18mm SPIRATION INTERBRONCHIAL VALVES (IBV);  Surgeon: Melrose Nakayama, MD;  Location: [place];  Service: Thoracic;  Laterality: N/A;  . VIDEO BRONCHOSCOPY WITH INSERTION OF INTERBRONCHIAL VALVE (IBV) N/A 06/01/2016   Procedure: VIDEO BRONCHOSCOPY WITH REMOVAL OF INTERBRONCHIAL VALVE (IBV);  Surgeon: Melrose Nakayama, MD;  Location: [facility];  Service: Thoracic;  Laterality: N/A;    REVIEW OF SYSTEMS:  A comprehensive review of systems was negative.   PHYSICAL EXAMINATION: General appearance: alert, cooperative and no distress Head: Normocephalic, without obvious abnormality, atraumatic Neck: no adenopathy, no JVD, supple, symmetrical, trachea midline and thyroid not enlarged, symmetric, no tenderness/mass/nodules Lymph nodes: Cervical, supraclavicular, and axillary nodes normal. Resp: clear to auscultation bilaterally Back: symmetric, no curvature. ROM normal. No CVA tenderness. Cardio: regular rate and rhythm, S1, S2 normal, no murmur, click, rub or gallop GI: soft, non-tender; bowel sounds normal; no masses,  no organomegaly Extremities: extremities normal, atraumatic, no cyanosis or edema  ECOG PERFORMANCE STATUS: 0 - Asymptomatic  Blood pressure 110/75, pulse 79, temperature 98.9 F (37.2 C), temperature source Oral, resp. rate 18, height 5\' 6"  (1.676 m), weight 137 lb (62.1 kg), SpO2 100 %, unknown if currently breastfeeding.  LABORATORY DATA: Lab Results  Component Value Date   WBC 3.3 (L) 05/20/2018  HGB 13.4 05/20/2018   HCT 39.9 05/20/2018   MCV 95.6 05/20/2018   PLT 187 05/20/2018      Chemistry      Component Value Date/Time   NA 140 05/20/2018 0857   NA  139 05/03/2017 0803   K 4.0 05/20/2018 0857   K 4.1 05/03/2017 0803   CL 105 05/20/2018 0857   CO2 26 05/20/2018 0857   CO2 23 05/03/2017 0803   BUN 13 05/20/2018 0857   BUN 11.3 05/03/2017 0803   CREATININE 0.81 05/20/2018 0857   CREATININE 0.7 05/03/2017 0803      Component Value Date/Time   CALCIUM 9.8 05/20/2018 0857   CALCIUM 9.6 05/03/2017 0803   ALKPHOS 83 05/20/2018 0857   ALKPHOS 66 05/03/2017 0803   AST 16 05/20/2018 0857   AST 14 05/03/2017 0803   ALT 13 05/20/2018 0857   ALT 11 05/03/2017 0803   BILITOT 0.5 05/20/2018 0857   BILITOT 0.38 05/03/2017 0803       RADIOGRAPHIC STUDIES: No results found.  ASSESSMENT AND PLAN: This is a very pleasant 42 years old white female with a stage IA carcinoid tumor involving the right hilar area status post right middle and lower bilobectomies with lymph node dissection. The patient is currently on observation and she is feeling fine. I recommended for her to have repeat CT scan of the chest performed in the next few days.  If this scan showed no concerning findings for disease recurrence, I will see her back for follow-up visit in 1 year with repeat CT scan of the chest. The patient was advised to call immediately if she has any concerning symptoms in the interval. The patient voices understanding of current disease status and treatment options and is in agreement with the current care plan. All questions were answered. The patient knows to call the clinic with any problems, questions or concerns. We can certainly see the patient much sooner if necessary. I spent 10 minutes counseling the patient face to face. The total time spent in the appointment was 15 minutes.  Disclaimer: This note was dictated with voice recognition software. Similar sounding words can inadvertently be transcribed and may not be corrected upon review.

## 2018-05-23 NOTE — Telephone Encounter (Signed)
Scheduled appt per 8/5 los- sent reminder letter in the mail with appt date and time.

## 2018-05-25 ENCOUNTER — Ambulatory Visit (HOSPITAL_COMMUNITY)
Admission: RE | Admit: 2018-05-25 | Discharge: 2018-05-25 | Disposition: A | Discharge status: Home / Self Care | Payer: BLUE CROSS/BLUE SHIELD | Source: Ambulatory Visit | Attending: Internal Medicine | Admitting: Internal Medicine

## 2018-05-25 ENCOUNTER — Encounter (HOSPITAL_COMMUNITY): Payer: Self-pay

## 2018-05-25 DIAGNOSIS — D3A090 Benign carcinoid tumor of the bronchus and lung: Secondary | ICD-10-CM | POA: Insufficient documentation

## 2018-05-25 DIAGNOSIS — Z902 Acquired absence of lung [part of]: Secondary | ICD-10-CM | POA: Diagnosis not present

## 2018-05-25 DIAGNOSIS — R918 Other nonspecific abnormal finding of lung field: Secondary | ICD-10-CM | POA: Diagnosis not present

## 2018-05-25 MED ORDER — IOHEXOL 300 MG/ML  SOLN
75.0000 mL | Freq: Once | INTRAMUSCULAR | Status: AC | PRN
  Administered 2018-05-25: 75 mL via INTRAVENOUS

## 2018-05-26 ENCOUNTER — Telehealth: Payer: Self-pay | Admitting: Medical Oncology

## 2018-05-26 NOTE — Telephone Encounter (Signed)
LVM-Per Mohamed I told pt "She has very small nodule 4 mm that needs close follow up on the upcoming scan. Please let her know. Nothing else needs to be done at this point..." Scan date changed to feb . Schedule request sent to f/u in feb/March.

## 2018-05-31 ENCOUNTER — Telehealth: Payer: Self-pay | Admitting: Internal Medicine

## 2018-05-31 NOTE — Telephone Encounter (Signed)
Scheduled appt per 8/8 sch message and sent reminder letter in the mail with appt date and time.

## 2018-12-13 ENCOUNTER — Telehealth (HOSPITAL_COMMUNITY): Payer: Self-pay

## 2018-12-15 ENCOUNTER — Inpatient Hospital Stay: Payer: BLUE CROSS/BLUE SHIELD | Attending: Internal Medicine

## 2018-12-19 ENCOUNTER — Inpatient Hospital Stay: Payer: BLUE CROSS/BLUE SHIELD | Attending: Internal Medicine | Admitting: Internal Medicine

## 2019-03-07 ENCOUNTER — Encounter (HOSPITAL_COMMUNITY): Payer: Self-pay | Admitting: Emergency Medicine

## 2019-03-07 ENCOUNTER — Other Ambulatory Visit: Payer: Self-pay

## 2019-03-07 ENCOUNTER — Ambulatory Visit (HOSPITAL_COMMUNITY)
Admission: EM | Admit: 2019-03-07 | Discharge: 2019-03-07 | Disposition: A | Discharge status: Home / Self Care | Payer: BLUE CROSS/BLUE SHIELD | Attending: Family Medicine | Admitting: Family Medicine

## 2019-03-07 DIAGNOSIS — Z3202 Encounter for pregnancy test, result negative: Secondary | ICD-10-CM

## 2019-03-07 DIAGNOSIS — R109 Unspecified abdominal pain: Secondary | ICD-10-CM | POA: Diagnosis present

## 2019-03-07 DIAGNOSIS — R000 Tachycardia, unspecified: Secondary | ICD-10-CM | POA: Diagnosis not present

## 2019-03-07 DIAGNOSIS — Z1389 Encounter for screening for other disorder: Secondary | ICD-10-CM

## 2019-03-07 LAB — POCT URINALYSIS DIP (DEVICE)
Bilirubin Urine: NEGATIVE
Glucose, UA: NEGATIVE mg/dL
Ketones, ur: NEGATIVE mg/dL
Nitrite: NEGATIVE
Protein, ur: NEGATIVE mg/dL
Specific Gravity, Urine: >=1.03 (ref 1.005–1.030)
Urobilinogen, UA: 0.2 mg/dL (ref 0.0–1.0)
pH: 6 (ref 5.0–8.0)

## 2019-03-07 LAB — POCT PREGNANCY, URINE: Preg Test, Ur: NEGATIVE

## 2019-03-07 MED ORDER — CEPHALEXIN 500 MG PO CAPS: 500.0000 mg | ORAL_CAPSULE | Freq: Three times a day (TID) | ORAL | 0 refills | Status: DC

## 2019-03-07 NOTE — ED Provider Notes (Signed)
[place]    CSN: [id number] Arrival date & time: 03/07/19  1255     History   Chief Complaint Chief Complaint  Patient presents with  . Flank Pain    HPI Cathy Little is a 43 y.o. female.   Pt sts left sided flank and side pain x 2 weeks following pains in legs before that.  No fever, dysuria, nausea, diarrhea, trauma.  Patient is originally from [place] and speaks [place].  She took some "African medicine" for the legs and the muscle soreness there resolved.  She has done more exercise lately.  She has not taken ibuprofen because her doctors told her to avoid this after the carcinoid surgery.  She also has a h/o constipation.  PMHx is significant for h/o carcinoid tumor.     Past Medical History:  Diagnosis Date  . Constipation   . 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 Shortness of breath dyspnea     at times- "heat makes it hard to breath- :out side - breathe well.":    Patient Active Problem List   Diagnosis Date Noted  . 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 OVARIAN CYST REMOVAL Left 06/30/2014  . VIDEO ASSISTED THORACOSCOPY (VATS)/ LOBECTOMY Right 03/18/2016   Procedure: VIDEO ASSISTED THORACOSCOPY (VATS)/RIGHT MIDDLE AND LOWER  BILOBECTOMY ;  Surgeon: Melrose Nakayama, MD;  Location: [place];  Service: Thoracic;  Laterality: Right;  Marland Kitchen VIDEO BRONCHOSCOPY N/A 03/18/2016   Procedure: VIDEO BRONCHOSCOPY;  Surgeon: Melrose Nakayama, MD;  Location: [place];  Service: Thoracic;  Laterality: N/A;  . VIDEO BRONCHOSCOPY WITH INSERTION OF INTERBRONCHIAL VALVE (IBV) N/A 04/01/2016   Procedure: VIDEO BRONCHOSCOPY WITH INSERTION OF (2) 65mm and (1) 78mm SPIRATION INTERBRONCHIAL VALVES (IBV);  Surgeon: Melrose Nakayama, MD;  Location: [facility];  Service: Thoracic;  Laterality: N/A;  . VIDEO BRONCHOSCOPY WITH INSERTION OF  INTERBRONCHIAL VALVE (IBV) N/A 06/01/2016   Procedure: VIDEO BRONCHOSCOPY WITH REMOVAL OF INTERBRONCHIAL VALVE (IBV);  Surgeon: Melrose Nakayama, MD;  Location: [facility] OR;  Service: Thoracic;  Laterality: N/A;    OB History    Gravida  3   Para  2   Term      Preterm      AB      Living  3     SAB      TAB      Ectopic      Multiple      Live Births  2            Home Medications    Prior to Admission medications   Medication Sig Start Date End Date Taking? Authorizing Provider  Acetaminophen (TYLENOL PO) Take by mouth.    [provider]  cephALEXin (KEFLEX) 500 MG capsule Take 1 capsule (500 mg total) by mouth 3 (three) times daily. 03/07/19   Robyn Haber, MD    Family History History reviewed. No pertinent family history.  Social History Social History   Tobacco Use  . Smoking status: Never Smoker  . Smokeless tobacco: Never Used  Substance Use Topics  . Alcohol use: No  . Drug use: No     Allergies   Oxycodone   Review of Systems Review of Systems  Gastrointestinal: Positive for abdominal pain.  All other systems reviewed and are negative.    Physical  Exam Triage Vital Signs ED Triage Vitals  Enc Vitals Group     BP 03/07/19 1318 128/69     Pulse Rate 03/07/19 1318 (!) 106     Resp 03/07/19 1318 18     Temp 03/07/19 1318 98.1 F (36.7 C)     Temp Source 03/07/19 1318 Oral     SpO2 03/07/19 1318 100 %     Weight --      Height --      Head Circumference --      Peak Flow --      Pain Score 03/07/19 1319 5     Pain Loc --      Pain Edu? --      Excl. in [place]? --    No data found.  Updated Vital Signs BP 128/69 (BP Location: Right Arm)   Pulse (!) 106   Temp 98.1 F (36.7 C) (Oral)   Resp 18   SpO2 100%    Physical Exam Vitals signs and nursing note reviewed.  Constitutional:      General: She is not in acute distress.    Appearance: Normal appearance. She is normal weight. She is not ill-appearing or  toxic-appearing.  HENT:     Nose: Nose normal.     Mouth/Throat:     Pharynx: Oropharynx is clear.  Eyes:     Conjunctiva/sclera: Conjunctivae normal.  Neck:     Musculoskeletal: Normal range of motion and neck supple.  Cardiovascular:     Rate and Rhythm: Regular rhythm. Tachycardia present.  Pulmonary:     Effort: Pulmonary effort is normal.     Breath sounds: Normal breath sounds.  Abdominal:     General: Abdomen is flat. There is no distension.     Palpations: There is no mass.     Tenderness: There is no abdominal tenderness.  Musculoskeletal: Normal range of motion.  Skin:    General: Skin is warm and dry.  Neurological:     General: No focal deficit present.     Mental Status: She is alert and oriented to person, place, and time.  Psychiatric:        Mood and Affect: Mood normal.        Behavior: Behavior normal.        Thought Content: Thought content normal.        Judgment: Judgment normal.      UC Treatments / Results  Labs (all labs ordered are listed, but only abnormal results are displayed) Labs Reviewed  POCT URINALYSIS DIP (DEVICE) - Abnormal; Notable for the following components:      Result Value   Hgb urine dipstick TRACE (*)    Leukocytes,Ua TRACE (*)    All other components within normal limits  URINE CULTURE  POC URINE PREG, ED  POCT PREGNANCY, URINE    EKG None  Radiology No results found.  Procedures Procedures (including critical care time)  Medications Ordered in UC Medications - No data to display  Initial Impression / Assessment and Plan / UC Course  I have reviewed the triage vital signs and the nursing notes.  Pertinent labs & imaging results that were available during my care of the patient were reviewed by me and considered in my medical decision making (see chart for details).    Final Clinical Impressions(s) / UC Diagnoses   Final diagnoses:  Left flank pain     Discharge Instructions     There is some  evidence of a  urinary tract infection, so I am ordering some antibiotic for you.  Please return if you are not improving.    ED Prescriptions    Medication Sig Dispense Auth. Provider   cephALEXin (KEFLEX) 500 MG capsule Take 1 capsule (500 mg total) by mouth 3 (three) times daily. 15 capsule Robyn Haber, MD     Controlled Substance Prescriptions [place] Controlled Substance Registry consulted? Not Applicable   Robyn Haber, MD 03/07/19 1409

## 2019-03-07 NOTE — Discharge Instructions (Addendum)
There is some evidence of a urinary tract infection, so I am ordering some antibiotic for you.  Please return if you are not improving.

## 2019-03-07 NOTE — ED Triage Notes (Signed)
Pt sts left sided flank and side pain x 1 week

## 2019-03-08 LAB — URINE CULTURE: Culture: <10000 — AB

## 2019-03-14 ENCOUNTER — Ambulatory Visit (HOSPITAL_COMMUNITY)
Admission: EM | Admit: 2019-03-14 | Discharge: 2019-03-14 | Disposition: A | Discharge status: Home / Self Care | Payer: BLUE CROSS/BLUE SHIELD | Attending: Family Medicine | Admitting: Family Medicine

## 2019-03-14 ENCOUNTER — Encounter (HOSPITAL_COMMUNITY): Payer: Self-pay

## 2019-03-14 ENCOUNTER — Other Ambulatory Visit: Payer: Self-pay

## 2019-03-14 DIAGNOSIS — R1032 Left lower quadrant pain: Secondary | ICD-10-CM | POA: Insufficient documentation

## 2019-03-14 LAB — POCT URINALYSIS DIP (DEVICE)
Bilirubin Urine: NEGATIVE
Glucose, UA: NEGATIVE mg/dL
Hgb urine dipstick: NEGATIVE
Ketones, ur: NEGATIVE mg/dL
Leukocytes,Ua: NEGATIVE
Nitrite: NEGATIVE
Protein, ur: NEGATIVE mg/dL
Specific Gravity, Urine: >=1.03 (ref 1.005–1.030)
Urobilinogen, UA: 0.2 mg/dL (ref 0.0–1.0)
pH: 6.5 (ref 5.0–8.0)

## 2019-03-14 LAB — POCT PREGNANCY, URINE: Preg Test, Ur: NEGATIVE

## 2019-03-14 MED ORDER — DOCUSATE SODIUM 100 MG PO CAPS: 100.0000 mg | ORAL_CAPSULE | Freq: Every day | ORAL | 0 refills | Status: AC

## 2019-03-14 MED ORDER — NAPROXEN 500 MG PO TABS: 500.0000 mg | ORAL_TABLET | Freq: Two times a day (BID) | ORAL | 0 refills | Status: AC

## 2019-03-14 NOTE — ED Provider Notes (Signed)
[place]    CSN: [id number] Arrival date & time: 03/14/19  9562     History   Chief Complaint Chief Complaint  Patient presents with  . Flank Pain    HPI Cathy Little is a 43 y.o. female history of malignant carcinoid tumor of lung, constipation presenting today for evaluation of left lower quadrant pain.  Patient states that over the past 3 weeks she has had discomfort to her left side and often in her left lower quadrant of her abdomen.  States pain will come and go.  She also states that over the past few months she has had lower leg pain in both legs.  She denies any injury.  She was seen here approximately 1 week ago and was noted to have possible UTI and initiated on Keflex.  She continues to have discomfort.  She denies any urinary symptoms of dysuria or increased frequency.  Denies hematuria.  Last menstrual period was 5/10.  Denies any abnormal vaginal discharge or pelvic pain.  Denies any diarrhea.  Typically has bowel movements every 3 days but states this is normal for her.  Pain does not change with eating.  Denies nausea or vomiting.  Denies any back pain or injury.  Denies recent heavy lifting.  Denies numbness or tingling into legs.  She also notes that prior to onset of symptoms she had began running for approximately 3 to 4 days.  And then stopped after she started developed a discomfort in her abdomen.  She denies any lightheadedness or dizziness.  Denies chest pain or shortness of breath.  Denies associated respiratory or URI symptoms.  Denies fever, chills or body aches.  She is not tried taking anything other than the Keflex for symptoms.  HPI  Past Medical History:  Diagnosis Date  . Constipation   . 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 Shortness of breath dyspnea     at times- "heat makes it hard to breath- :out side - breathe well.":    Patient Active Problem List   Diagnosis Date Noted   . 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 OVARIAN CYST REMOVAL Left 06/30/2014  . VIDEO ASSISTED THORACOSCOPY (VATS)/ LOBECTOMY Right 03/18/2016   Procedure: VIDEO ASSISTED THORACOSCOPY (VATS)/RIGHT MIDDLE AND LOWER  BILOBECTOMY ;  Surgeon: Melrose Nakayama, MD;  Location: [place];  Service: Thoracic;  Laterality: Right;  Marland Kitchen VIDEO BRONCHOSCOPY N/A 03/18/2016   Procedure: VIDEO BRONCHOSCOPY;  Surgeon: Melrose Nakayama, MD;  Location: [place];  Service: Thoracic;  Laterality: N/A;  . VIDEO BRONCHOSCOPY WITH INSERTION OF INTERBRONCHIAL VALVE (IBV) N/A 04/01/2016   Procedure: VIDEO BRONCHOSCOPY WITH INSERTION OF (2) 31mm and (1) 79mm SPIRATION INTERBRONCHIAL VALVES (IBV);  Surgeon: Melrose Nakayama, MD;  Location: [place];  Service: Thoracic;  Laterality: N/A;  . VIDEO BRONCHOSCOPY WITH INSERTION OF INTERBRONCHIAL VALVE (IBV) N/A 06/01/2016   Procedure: VIDEO BRONCHOSCOPY WITH REMOVAL OF INTERBRONCHIAL VALVE (IBV);  Surgeon: Melrose Nakayama, MD;  Location: [facility] OR;  Service: Thoracic;  Laterality: N/A;    OB History    Gravida  3   Para  2   Term      Preterm      AB      Living  3     SAB      TAB  Ectopic      Multiple      Live Births  2            Home Medications    Prior to Admission medications   Medication Sig Start Date End Date Taking? Authorizing Provider  Acetaminophen (TYLENOL PO) Take by mouth.    [provider]  cephALEXin (KEFLEX) 500 MG capsule Take 1 capsule (500 mg total) by mouth 3 (three) times daily. 03/07/19   Robyn Haber, MD  docusate sodium (COLACE) 100 MG capsule Take 1 capsule (100 mg total) by mouth daily. 03/14/19   Xaivier Malay C, PA-C  naproxen (NAPROSYN) 500 MG tablet Take 1 tablet (500 mg total) by mouth 2 (two) times daily. 03/14/19   Mohammad Granade, Elesa Hacker, PA-C    Family History Family History  Problem Relation Age of Onset  .  Healthy Mother   . Diabetes Father     Social History Social History   Tobacco Use  . Smoking status: Never Smoker  . Smokeless tobacco: Never Used  Substance Use Topics  . Alcohol use: No  . Drug use: No     Allergies   Oxycodone   Review of Systems Review of Systems  Constitutional: Negative for activity change, appetite change, chills, fatigue and fever.  HENT: Negative for congestion, ear pain, rhinorrhea, sinus pressure, sore throat and trouble swallowing.   Eyes: Negative for discharge and redness.  Respiratory: Negative for cough, chest tightness and shortness of breath.   Cardiovascular: Negative for chest pain.  Gastrointestinal: Positive for abdominal pain and constipation. Negative for diarrhea, nausea and vomiting.  Genitourinary: Negative for difficulty urinating, dysuria, genital sores, hematuria, menstrual problem, vaginal bleeding and vaginal discharge.  Musculoskeletal: Negative for myalgias.  Skin: Negative for rash.  Neurological: Negative for dizziness, light-headedness and headaches.     Physical Exam Triage Vital Signs ED Triage Vitals  Enc Vitals Group     BP 03/14/19 1039 104/69     Pulse Rate 03/14/19 1039 83     Resp 03/14/19 1039 18     Temp 03/14/19 1039 98 F (36.7 C)     Temp src --      SpO2 03/14/19 1039 98 %     Weight --      Height --      Head Circumference --      Peak Flow --      Pain Score 03/14/19 1036 8     Pain Loc --      Pain Edu? --      Excl. in [place]? --    No data found.  Updated Vital Signs BP 104/69   Pulse 83   Temp 98 F (36.7 C)   Resp 18   LMP 02/26/2019   SpO2 98%   Visual Acuity Right Eye Distance:   Left Eye Distance:   Bilateral Distance:    Right Eye Near:   Left Eye Near:    Bilateral Near:     Physical Exam Vitals signs and nursing note reviewed.  Constitutional:      General: She is not in acute distress.    Appearance: She is well-developed.  HENT:     Head: Normocephalic and  atraumatic.     Mouth/Throat:     Comments: Oral mucosa pink and moist, no tonsillar enlargement or exudate. Posterior pharynx patent and nonerythematous, no uvula deviation or swelling. Normal phonation. Eyes:     Extraocular Movements: Extraocular movements intact.  Conjunctiva/sclera: Conjunctivae normal.     Pupils: Pupils are equal, round, and reactive to light.  Neck:     Musculoskeletal: Neck supple.  Cardiovascular:     Rate and Rhythm: Normal rate and regular rhythm.     Heart sounds: No murmur.  Pulmonary:     Effort: Pulmonary effort is normal. No respiratory distress.     Breath sounds: Normal breath sounds.     Comments: Breathing comfortably at rest, CTABL, no wheezing, rales or other adventitious sounds auscultated Abdominal:     Palpations: Abdomen is soft.     Tenderness: There is abdominal tenderness.     Comments: Soft, nondistended, mild tenderness to left lower quadrant, does not extend to flank or posteriorly to back.  No CVA tenderness, negative rebound, negative Rovsing.  Genitourinary:    Comments: Normal external female genitalia, vaginal mucosa pink, cervix with erythematous area extending posteriorly from os, no discharge noted, no cervical motion tenderness, no adnexal masses palpated Skin:    General: Skin is warm and dry.  Neurological:     Mental Status: She is alert.      UC Treatments / Results  Labs (all labs ordered are listed, but only abnormal results are displayed) Labs Reviewed  POCT URINALYSIS DIP (DEVICE)  POC URINE PREG, ED  POCT PREGNANCY, URINE  CERVICOVAGINAL ANCILLARY ONLY    EKG None  Radiology No results found.  Procedures Procedures (including critical care time)  Medications Ordered in UC Medications - No data to display  Initial Impression / Assessment and Plan / UC Course  I have reviewed the triage vital signs and the nursing notes.  Pertinent labs & imaging results that were available during my care of  the patient were reviewed by me and considered in my medical decision making (see chart for details).     Discussed possible muscular etiology versus related to constipation given bowel movements only every 3 days.  Do not suspect underlying abdominal emergency.  Discussed lifestyle recommendations for constipation as well as potential use of stool softener/MiraLAX.  Vaginal swab obtained per patient request, advised to follow-up with OB/GYN for further evaluation if discomfort persisting to rule out GYN causes of left lower quadrant pain.  Tolerating oral intake.  Vital signs stable.  Naprosyn as needed for discomfort and legs and pain temporarily.  Continue to monitor,Discussed strict return precautions. Patient verbalized understanding and is agreeable with plan.  Final Clinical Impressions(s) / UC Diagnoses   Final diagnoses:  LLQ pain     Discharge Instructions     Please use Naprosyn twice daily as needed for side pain, leg pain or headaches  Follow up with primary care/OBGYN for further evaluation of discomfort  Pain could be from constipation:  May try docusate daily to help move bowels more regularly.  If more severe Please use Miralax for moderate to severe constipation. Take this once a day for the next 2-3 days. Please also start docusate stool softener, twice a day for at least 1 week. If stools become loose, cut down to once a day for another week. If stools remain loose, cut back to 1 pill every other day for a third week. You can stop docusate thereafter and resume as needed for constipation.  To help reduce constipation and promote bowel health: 1. Drink at least 80 ounces of water each day 2. Eat plenty of fiber (fruits, vegetables, whole grains, legumes) 3. Be physically active or exercise including walking, jogging, swimming, yoga, etc. 4. For active  constipation use a stool softener (docusate) or an osmotic laxative (like Miralax) each day, or as needed.   ED  Prescriptions    Medication Sig Dispense Auth. Provider   naproxen (NAPROSYN) 500 MG tablet Take 1 tablet (500 mg total) by mouth 2 (two) times daily. 30 tablet Presleigh Feldstein C, PA-C   docusate sodium (COLACE) 100 MG capsule Take 1 capsule (100 mg total) by mouth daily. 30 capsule Johnasia Liese C, PA-C     Controlled Substance Prescriptions  Controlled Substance Registry consulted? Not Applicable   Janith Lima, [place] 03/14/19 1811

## 2019-03-14 NOTE — ED Triage Notes (Signed)
Pt presents with continued left flank pain x 2 weeks. Pt also reports left leg pain.  Denies any urinary symptoms. Reports being given Keflex last week for UTI, reports she did take that medication.

## 2019-03-14 NOTE — Discharge Instructions (Signed)
Please use Naprosyn twice daily as needed for side pain, leg pain or headaches  Follow up with primary care/OBGYN for further evaluation of discomfort  Pain could be from constipation:  May try docusate daily to help move bowels more regularly.  If more severe Please use Miralax for moderate to severe constipation. Take this once a day for the next 2-3 days. Please also start docusate stool softener, twice a day for at least 1 week. If stools become loose, cut down to once a day for another week. If stools remain loose, cut back to 1 pill every other day for a third week. You can stop docusate thereafter and resume as needed for constipation.  To help reduce constipation and promote bowel health: 1. Drink at least 80 ounces of water each day 2. Eat plenty of fiber (fruits, vegetables, whole grains, legumes) 3. Be physically active or exercise including walking, jogging, swimming, yoga, etc. 4. For active constipation use a stool softener (docusate) or an osmotic laxative (like Miralax) each day, or as needed.

## 2019-03-15 LAB — CERVICOVAGINAL ANCILLARY ONLY
Bacterial vaginitis: NEGATIVE
Candida vaginitis: POSITIVE — AB
Chlamydia: NEGATIVE
Neisseria Gonorrhea: NEGATIVE
Trichomonas: NEGATIVE

## 2019-03-17 ENCOUNTER — Telehealth (HOSPITAL_COMMUNITY): Payer: Self-pay | Admitting: Emergency Medicine

## 2019-03-17 MED ORDER — FLUCONAZOLE 150 MG PO TABS: 150.0000 mg | ORAL_TABLET | Freq: Once | ORAL | 0 refills | Status: AC

## 2019-03-17 MED FILL — FLUCONAZOLE 150 MG TABS: 150 | 3 days supply | Qty: 2 | Fill #0

## 2019-03-17 NOTE — Telephone Encounter (Signed)
Test for candida (yeast) was positive.  Prescription for fluconazole 150mg  po now, repeat dose in 3d if needed, #2 no refills, sent to the pharmacy of record.  Recheck or followup with PCP for further evaluation if symptoms are not improving.    Attempted to reach patient. No answer at this time. Voicemail left.

## 2019-03-17 NOTE — Telephone Encounter (Signed)
Patient contacted and made aware of all results, all questions answered.   

## 2019-04-20 ENCOUNTER — Other Ambulatory Visit: Payer: Self-pay | Admitting: Family Medicine

## 2019-04-20 DIAGNOSIS — Z1231 Encounter for screening mammogram for malignant neoplasm of breast: Secondary | ICD-10-CM

## 2019-05-03 ENCOUNTER — Other Ambulatory Visit: Payer: Self-pay

## 2019-05-03 ENCOUNTER — Emergency Department (HOSPITAL_COMMUNITY): Payer: BLUE CROSS/BLUE SHIELD

## 2019-05-03 ENCOUNTER — Emergency Department (HOSPITAL_COMMUNITY)
Admission: EM | Admit: 2019-05-03 | Discharge: 2019-05-03 | Disposition: A | Discharge status: Home / Self Care | Payer: BLUE CROSS/BLUE SHIELD | Attending: Emergency Medicine | Admitting: Emergency Medicine

## 2019-05-03 DIAGNOSIS — Z20828 Contact with and (suspected) exposure to other viral communicable diseases: Secondary | ICD-10-CM | POA: Diagnosis not present

## 2019-05-03 DIAGNOSIS — R0602 Shortness of breath: Secondary | ICD-10-CM | POA: Diagnosis present

## 2019-05-03 DIAGNOSIS — Z79899 Other long term (current) drug therapy: Secondary | ICD-10-CM | POA: Diagnosis not present

## 2019-05-03 LAB — HEPATIC FUNCTION PANEL
ALT: 13 U/L (ref 0–44)
AST: 18 U/L (ref 15–41)
Albumin: 4.2 g/dL (ref 3.5–5.0)
Alkaline Phosphatase: 49 U/L (ref 38–126)
Bilirubin, Direct: 0.2 mg/dL (ref 0.0–0.2)
Indirect Bilirubin: 0.9 mg/dL (ref 0.3–0.9)
Total Bilirubin: 1.1 mg/dL (ref 0.3–1.2)
Total Protein: 7.7 g/dL (ref 6.5–8.1)

## 2019-05-03 LAB — CBC WITH DIFFERENTIAL/PLATELET
Abs Immature Granulocytes: 0.01 10*3/uL (ref 0.00–0.07)
Basophils Absolute: 0 10*3/uL (ref 0.0–0.1)
Basophils Relative: 1 %
Eosinophils Absolute: 0 10*3/uL (ref 0.0–0.5)
Eosinophils Relative: 1 %
HCT: 41 % (ref 36.0–46.0)
Hemoglobin: 14.1 g/dL (ref 12.0–15.0)
Immature Granulocytes: 0 %
Lymphocytes Relative: 34 %
Lymphs Abs: 1.3 10*3/uL (ref 0.7–4.0)
MCH: 32.3 pg (ref 26.0–34.0)
MCHC: 34.4 g/dL (ref 30.0–36.0)
MCV: 94 fL (ref 80.0–100.0)
Monocytes Absolute: 0.4 10*3/uL (ref 0.1–1.0)
Monocytes Relative: 10 %
Neutro Abs: 2 10*3/uL (ref 1.7–7.7)
Neutrophils Relative %: 54 %
Platelets: 203 10*3/uL (ref 150–400)
RBC: 4.36 MIL/uL (ref 3.87–5.11)
RDW: 13.1 % (ref 11.5–15.5)
WBC: 3.8 10*3/uL — ABNORMAL LOW (ref 4.0–10.5)
nRBC: 0 % (ref 0.0–0.2)

## 2019-05-03 LAB — BASIC METABOLIC PANEL
Anion gap: 11 (ref 5–15)
BUN: 8 mg/dL (ref 6–20)
CO2: 22 mmol/L (ref 22–32)
Calcium: 9.5 mg/dL (ref 8.9–10.3)
Chloride: 106 mmol/L (ref 98–111)
Creatinine, Ser: 0.75 mg/dL (ref 0.44–1.00)
GFR calc Af Amer: >60 mL/min (ref >60)
GFR calc non Af Amer: >60 mL/min (ref >60)
Glucose, Bld: 94 mg/dL (ref 70–99)
Potassium: 3.6 mmol/L (ref 3.5–5.1)
Sodium: 139 mmol/L (ref 135–145)

## 2019-05-03 LAB — I-STAT BETA HCG BLOOD, ED (MC, WL, AP ONLY): I-stat hCG, quantitative: <5 m[IU]/mL (ref <5)

## 2019-05-03 LAB — SARS CORONAVIRUS 2 BY RT PCR (HOSPITAL ORDER, PERFORMED IN ~~LOC~~ HOSPITAL LAB): SARS Coronavirus 2: NEGATIVE

## 2019-05-03 MED ORDER — IOHEXOL 350 MG/ML SOLN
100.0000 mL | Freq: Once | INTRAVENOUS | Status: AC | PRN
  Administered 2019-05-03: 75 mL via INTRAVENOUS

## 2019-05-03 NOTE — ED Triage Notes (Signed)
Pt arrives via POV from urgent care. Pt has had SOB for the last 4-5 days, worse with exertion. Has right lobectomy from 2017. UC xray showed pleural effusion and poss pneumothorax. VSS at present. Alert, oriented x4. UC recommends chest CT for further eval.

## 2019-05-03 NOTE — Discharge Instructions (Signed)
Your testing today does not show any significant new findings other than a very small abnormality at the top of your lung.  I have discussed this with the cardiothoracic surgeon, Dr. Roxan Hockey who has looked at the CT scan and agrees that you can go home without the need for close follow-up.  You should return immediately for severe or worsening pain, difficulty breathing or fever or any worsening symptoms to the emergency department for a repeat evaluation.

## 2019-05-03 NOTE — ED Provider Notes (Signed)
[facility] EMERGENCY DEPARTMENT Provider Note   CSN: [id number] Arrival date & time: 05/03/19  4782    History   Chief Complaint Chief Complaint  Patient presents with   Shortness of Breath    HPI Cathy Little is a 43 y.o. female.     HPI  SOB for 5 days - has been intermittently - not exertional, and can even run - but states she feels tired - has some pain in the left chest with this, nothing makes the pain worse.  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  She has no fevers, no coughing, no swelling of the legs - was seen at [facility] yesterday - had xray with ? PTX vs effusion - sent to ED.  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    Past Surgical History:  Procedure Laterality Date   OVARIAN CYST REMOVAL Left 06/30/2014   VIDEO ASSISTED THORACOSCOPY (VATS)/ LOBECTOMY Right 03/18/2016   Procedure: VIDEO ASSISTED THORACOSCOPY (VATS)/RIGHT MIDDLE AND LOWER  BILOBECTOMY ;  Surgeon: Melrose Nakayama, MD;  Location: [place];  Service: Thoracic;  Laterality: Right;   VIDEO BRONCHOSCOPY N/A 03/18/2016   Procedure: VIDEO BRONCHOSCOPY;  Surgeon: Melrose Nakayama, MD;  Location: [place];  Service: Thoracic;  Laterality: N/A;   VIDEO BRONCHOSCOPY WITH INSERTION OF INTERBRONCHIAL VALVE (IBV) N/A 04/01/2016   Procedure: VIDEO BRONCHOSCOPY WITH INSERTION OF (2) 56mm and (1) 67mm SPIRATION INTERBRONCHIAL VALVES (IBV);  Surgeon: Melrose Nakayama, MD;  Location: [place];  Service: Thoracic;   Laterality: N/A;   VIDEO BRONCHOSCOPY WITH INSERTION OF INTERBRONCHIAL VALVE (IBV) N/A 06/01/2016   Procedure: VIDEO BRONCHOSCOPY WITH REMOVAL OF INTERBRONCHIAL VALVE (IBV);  Surgeon: Melrose Nakayama, MD;  Location: [facility] OR;  Service: Thoracic;  Laterality: N/A;     OB History    Gravida  3   Para  2   Term      Preterm      AB      Living  3     SAB      TAB      Ectopic      Multiple      Live Births  2            Home Medications    Prior to Admission medications   Medication Sig Start Date End Date Taking? Authorizing Provider  Acetaminophen (TYLENOL PO) Take by mouth.   Yes [provider]  Cholecalciferol (VITAMIN D3 SUPER STRENGTH) 50 MCG (2000 UT) TABS Take 2,000 Units by mouth daily. 01/09/19  Yes [provider]  cephALEXin (KEFLEX) 500 MG capsule Take 1 capsule (500 mg total) by mouth 3 (three) times daily. Patient not taking: Reported on 05/03/2019 03/07/19   Robyn Haber, MD  docusate sodium (COLACE) 100 MG capsule Take 1 capsule (100 mg total) by mouth daily. Patient not taking: Reported on 05/03/2019 03/14/19   Debara Pickett  C, PA-C  naproxen (NAPROSYN) 500 MG tablet Take 1 tablet (500 mg total) by mouth 2 (two) times daily. Patient not taking: Reported on 05/03/2019 03/14/19   Janith Lima, PA-C    Family History Family History  Problem Relation Age of Onset   Healthy Mother    Diabetes Father     Social History Social History   Tobacco Use   Smoking status: Never Smoker   Smokeless tobacco: Never Used  Substance Use Topics   Alcohol use: No   Drug use: No     Allergies   Oxycodone   Review of Systems Review of Systems  All other systems reviewed and are negative.    Physical Exam Updated Vital Signs BP 101/69    Pulse 87    Temp 99.5 F (37.5 C) (Oral)    Resp (!) 26    Ht 1.676 m (5\' 6" )    Wt 61.2 kg    LMP 04/18/2019 (Exact Date)    SpO2 100%    BMI 21.79 kg/m   Physical Exam Vitals  signs and nursing note reviewed.  Constitutional:      General: She is not in acute distress.    Appearance: She is well-developed.  HENT:     Head: Normocephalic and atraumatic.     Mouth/Throat:     Pharynx: No oropharyngeal exudate.  Eyes:     General: No scleral icterus.       Right eye: No discharge.        Left eye: No discharge.     Conjunctiva/sclera: Conjunctivae normal.     Pupils: Pupils are equal, round, and reactive to light.  Neck:     Musculoskeletal: Normal range of motion and neck supple.     Thyroid: No thyromegaly.     Vascular: No JVD.  Cardiovascular:     Rate and Rhythm: Normal rate and regular rhythm.     Heart sounds: Normal heart sounds. No murmur. No friction rub. No gallop.   Pulmonary:     Effort: Pulmonary effort is normal. No respiratory distress.     Breath sounds: Normal breath sounds. No wheezing or rales.     Comments: Speaks in full sentences, no increased WOB, no wheezing or rales. Abdominal:     General: Bowel sounds are normal. There is no distension.     Palpations: Abdomen is soft. There is no mass.     Tenderness: There is no abdominal tenderness.  Musculoskeletal: Normal range of motion.        General: No tenderness.     Right lower leg: No edema.     Left lower leg: No edema.  Lymphadenopathy:     Cervical: No cervical adenopathy.  Skin:    General: Skin is warm and dry.     Findings: No erythema or rash.  Neurological:     Mental Status: She is alert.     Coordination: Coordination normal.  Psychiatric:        Behavior: Behavior normal.      ED Treatments / Results  Labs (all labs ordered are listed, but only abnormal results are displayed) Labs Reviewed  CBC WITH DIFFERENTIAL/PLATELET - Abnormal; Notable for the following components:      Result Value   WBC 3.8 (*)    All other components within normal limits  SARS CORONAVIRUS 2 (HOSPITAL ORDER, [facility])  BASIC METABOLIC PANEL  HEPATIC  FUNCTION PANEL  I-STAT BETA HCG BLOOD, ED (  [facility], [facility], [facility] ONLY)    EKG EKG Interpretation  Date/Time:  Wednesday May 03 2019 08:45:43 EDT Ventricular Rate:  89 PR Interval:    QRS Duration: 78 QT Interval:  329 QTC Calculation: 401 R Axis:   63 Text Interpretation:  Sinus rhythm Borderline T abnormalities, anterior leads Confirmed by Noemi Chapel [phone]) on 05/03/2019 9:12:17 AM   Radiology Ct Angio Chest Pe W And/or Wo Contrast  Result Date: 05/03/2019 CLINICAL DATA:  Left upper chest pain and shortness of breath, worse on inspiration. EXAM: CT ANGIOGRAPHY CHEST WITH CONTRAST TECHNIQUE: Multidetector CT imaging of the chest was performed using the standard protocol during bolus administration of intravenous contrast. Multiplanar CT image reconstructions and MIPs were obtained to evaluate the vascular anatomy. CONTRAST:  24mL OMNIPAQUE IOHEXOL 350 MG/ML SOLN COMPARISON:  Chest CT dated 05/25/2018. FINDINGS: Cardiovascular: Satisfactory opacification of the pulmonary arteries to the segmental level. No evidence of pulmonary embolism. Normal heart size. No pericardial effusion. Mediastinum/Nodes: No enlarged mediastinal, hilar, or axillary lymph nodes. Thyroid gland, trachea, and esophagus demonstrate no significant findings. Lungs/Pleura: Interval small, loculated appearing right apical pneumothorax with a maximum thickness of 7 mm on image number 87 series 8. This measures 10 mm in maximum thickness on sagittal image number 50 series 10. Otherwise, stable right apical pleural and parenchymal scarring. The previously demonstrated new 4 mm right lung nodule is not currently visualized. The previously demonstrated 4 mm right lung nodule anteriorly is smaller and less dense, measuring 3 mm on image number 60 series 7. No new nodules are seen. Improved right infrahilar pleural thickening/fluid. Upper Abdomen: Unremarkable. Musculoskeletal: Unremarkable bones. Review of the MIP images confirms the above  findings. IMPRESSION: 1. Interval less than 5% probably loculated right apical pneumothorax. 2. No pulmonary emboli. 3. Mildly improved right infrahilar pleural thickening/fluid. 4. Stable right apical pleural and parenchymal scarring. Electronically Signed   By: Claudie Revering M.D.   On: 05/03/2019 12:11    Procedures Procedures (including critical care time)  Medications Ordered in ED Medications  iohexol (OMNIPAQUE) 350 MG/ML injection 100 mL (75 mLs Intravenous Contrast Given 05/03/19 1139)     Initial Impression / Assessment and Plan / ED Course  I have reviewed the triage vital signs and the nursing notes.  Pertinent labs & imaging results that were available during my care of the patient were reviewed by me and considered in my medical decision making (see chart for details).        The patient is in no distress, her lung sounds are clear and equal though slightly decreased on the right secondary to her prior surgery.  She is in no distress, she is not febrile, she does have a history of prior carcinoid tumor that did not require chemotherapy however she does note that a new nodule was seen at her visit in August 2019 and she has been unable to follow-up since secondary to insurance issues.  We will repeat a CT scan today to further rule out pneumothorax, worsening effusion or other complicating factors.  Patient is agreeable to the plan.  I discussed the plan of care with the cardiothoracic surgeon, Dr. Roxan Hockey who agrees that the patient can go home without an intervention, chest tube and does not need to have a repeat exam unless she becomes worsened in her symptoms.  I agree with this that she is negative for coronavirus, pneumonia or any other acute findings.  Final Clinical Impressions(s) / ED Diagnoses   Final diagnoses:  Shortness of  breath    ED Discharge Orders    None       Noemi Chapel, MD 05/03/19 1324

## 2019-05-22 ENCOUNTER — Inpatient Hospital Stay: Payer: BLUE CROSS/BLUE SHIELD | Attending: Internal Medicine

## 2019-05-24 ENCOUNTER — Inpatient Hospital Stay: Payer: BLUE CROSS/BLUE SHIELD | Admitting: Internal Medicine

## 2019-09-06 ENCOUNTER — Other Ambulatory Visit: Payer: Self-pay

## 2019-09-06 ENCOUNTER — Emergency Department (HOSPITAL_COMMUNITY)
Admission: EM | Admit: 2019-09-06 | Discharge: 2019-09-07 | Disposition: A | Discharge status: Home / Self Care | Payer: BLUE CROSS/BLUE SHIELD | Attending: Emergency Medicine | Admitting: Emergency Medicine

## 2019-09-06 DIAGNOSIS — R079 Chest pain, unspecified: Secondary | ICD-10-CM | POA: Diagnosis present

## 2019-09-06 DIAGNOSIS — Z79899 Other long term (current) drug therapy: Secondary | ICD-10-CM | POA: Diagnosis not present

## 2019-09-06 DIAGNOSIS — R0789 Other chest pain: Secondary | ICD-10-CM | POA: Insufficient documentation
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
## 2019-09-07 ENCOUNTER — Encounter: Payer: Self-pay | Admitting: Emergency Medicine

## 2019-09-07 ENCOUNTER — Emergency Department (HOSPITAL_COMMUNITY): Payer: BLUE CROSS/BLUE SHIELD

## 2019-09-07 ENCOUNTER — Other Ambulatory Visit: Payer: Self-pay

## 2019-09-07 MED ORDER — ONDANSETRON 4 MG PO TBDP
4.0000 mg | ORAL_TABLET | Freq: Once | ORAL | Status: AC
  Administered 2019-09-07: 4 mg via ORAL
  Filled 2019-09-07: qty 1

## 2019-09-07 MED ORDER — ONDANSETRON 4 MG PO TBDP: 4.0000 mg | ORAL_TABLET | Freq: Three times a day (TID) | ORAL | 0 refills | Status: AC | PRN

## 2019-09-07 MED ORDER — IBUPROFEN 800 MG PO TABS: 800.0000 mg | ORAL_TABLET | Freq: Three times a day (TID) | ORAL | 0 refills | Status: AC

## 2019-09-07 MED ORDER — HYDROCODONE-ACETAMINOPHEN 5-325 MG PO TABS: 1.0000 | ORAL_TABLET | ORAL | 0 refills | Status: AC | PRN

## 2019-09-07 MED ORDER — HYDROCODONE-ACETAMINOPHEN 5-325 MG PO TABS
1.0000 | ORAL_TABLET | Freq: Once | ORAL | Status: AC
  Administered 2019-09-07: 1 via ORAL
  Filled 2019-09-07: qty 1

## 2019-09-07 NOTE — Discharge Instructions (Signed)
Take the prescribed medication as directed.  Do not drive while taking pain medication, it can make you drowsy. Follow-up with your primary care doctor. Return to the ED for new or worsening symptoms.
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
## 2021-08-10 ENCOUNTER — Emergency Department (HOSPITAL_COMMUNITY)
Admission: EM | Admit: 2021-08-10 | Discharge: 2021-08-10 | Discharge status: Against Medical Advice | Payer: BLUE CROSS/BLUE SHIELD

## 2021-08-10 ENCOUNTER — Emergency Department (HOSPITAL_BASED_OUTPATIENT_CLINIC_OR_DEPARTMENT_OTHER): Payer: BC Managed Care – PPO

## 2021-08-10 ENCOUNTER — Encounter (HOSPITAL_BASED_OUTPATIENT_CLINIC_OR_DEPARTMENT_OTHER): Payer: Self-pay | Admitting: Emergency Medicine

## 2021-08-10 ENCOUNTER — Emergency Department (HOSPITAL_BASED_OUTPATIENT_CLINIC_OR_DEPARTMENT_OTHER)
Admission: EM | Admit: 2021-08-10 | Discharge: 2021-08-10 | Disposition: A | Discharge status: Home / Self Care | Payer: BC Managed Care – PPO | Attending: Emergency Medicine | Admitting: Emergency Medicine

## 2021-08-10 ENCOUNTER — Other Ambulatory Visit: Payer: Self-pay

## 2021-08-10 DIAGNOSIS — Z3A01 Less than 8 weeks gestation of pregnancy: Secondary | ICD-10-CM | POA: Insufficient documentation

## 2021-08-10 DIAGNOSIS — N939 Abnormal uterine and vaginal bleeding, unspecified: Secondary | ICD-10-CM

## 2021-08-10 DIAGNOSIS — Z85118 Personal history of other malignant neoplasm of bronchus and lung: Secondary | ICD-10-CM | POA: Diagnosis not present

## 2021-08-10 DIAGNOSIS — N9489 Other specified conditions associated with female genital organs and menstrual cycle: Secondary | ICD-10-CM | POA: Diagnosis not present

## 2021-08-10 DIAGNOSIS — R8271 Bacteriuria: Secondary | ICD-10-CM | POA: Diagnosis not present

## 2021-08-10 DIAGNOSIS — O209 Hemorrhage in early pregnancy, unspecified: Secondary | ICD-10-CM | POA: Diagnosis present

## 2021-08-10 DIAGNOSIS — O4690 Antepartum hemorrhage, unspecified, unspecified trimester: Secondary | ICD-10-CM

## 2021-08-10 LAB — LIPASE, BLOOD: Lipase: 23 U/L (ref 11–51)

## 2021-08-10 LAB — COMPREHENSIVE METABOLIC PANEL
ALT: 13 U/L (ref 0–44)
AST: 15 U/L (ref 15–41)
Albumin: 4.2 g/dL (ref 3.5–5.0)
Alkaline Phosphatase: 52 U/L (ref 38–126)
Anion gap: 8 (ref 5–15)
BUN: 10 mg/dL (ref 6–20)
CO2: 25 mmol/L (ref 22–32)
Calcium: 9.3 mg/dL (ref 8.9–10.3)
Chloride: 106 mmol/L (ref 98–111)
Creatinine, Ser: 0.52 mg/dL (ref 0.44–1.00)
GFR, Estimated: >60 mL/min (ref >60)
Glucose, Bld: 87 mg/dL (ref 70–99)
Potassium: 3.9 mmol/L (ref 3.5–5.1)
Sodium: 139 mmol/L (ref 135–145)
Total Bilirubin: 0.5 mg/dL (ref 0.3–1.2)
Total Protein: 7.7 g/dL (ref 6.5–8.1)

## 2021-08-10 LAB — URINALYSIS, ROUTINE W REFLEX MICROSCOPIC
Bilirubin Urine: NEGATIVE
Glucose, UA: NEGATIVE mg/dL
Ketones, ur: 15 mg/dL — AB
Leukocytes,Ua: NEGATIVE
Nitrite: NEGATIVE
Protein, ur: NEGATIVE mg/dL
Specific Gravity, Urine: 1.01 (ref 1.005–1.030)
pH: 6 (ref 5.0–8.0)

## 2021-08-10 LAB — CBC WITH DIFFERENTIAL/PLATELET
Abs Immature Granulocytes: 0.01 10*3/uL (ref 0.00–0.07)
Basophils Absolute: 0 10*3/uL (ref 0.0–0.1)
Basophils Relative: 0 %
Eosinophils Absolute: 0 10*3/uL (ref 0.0–0.5)
Eosinophils Relative: 1 %
HCT: 39 % (ref 36.0–46.0)
Hemoglobin: 13.3 g/dL (ref 12.0–15.0)
Immature Granulocytes: 0 %
Lymphocytes Relative: 33 %
Lymphs Abs: 1 10*3/uL (ref 0.7–4.0)
MCH: 32.1 pg (ref 26.0–34.0)
MCHC: 34.1 g/dL (ref 30.0–36.0)
MCV: 94.2 fL (ref 80.0–100.0)
Monocytes Absolute: 0.3 10*3/uL (ref 0.1–1.0)
Monocytes Relative: 11 %
Neutro Abs: 1.7 10*3/uL (ref 1.7–7.7)
Neutrophils Relative %: 55 %
Platelets: 222 10*3/uL (ref 150–400)
RBC: 4.14 MIL/uL (ref 3.87–5.11)
RDW: 14 % (ref 11.5–15.5)
WBC: 3.1 10*3/uL — ABNORMAL LOW (ref 4.0–10.5)
nRBC: 0 % (ref 0.0–0.2)

## 2021-08-10 LAB — ABO/RH: ABO/RH(D): O POS

## 2021-08-10 LAB — URINALYSIS, MICROSCOPIC (REFLEX)

## 2021-08-10 LAB — PREGNANCY, URINE: Preg Test, Ur: POSITIVE — AB

## 2021-08-10 LAB — HCG, QUANTITATIVE, PREGNANCY: hCG, Beta Chain, Quant, S: 1101 m[IU]/mL — ABNORMAL HIGH (ref <5)

## 2021-08-10 MED ORDER — SODIUM CHLORIDE 0.9 % IV BOLUS
1000.0000 mL | Freq: Once | INTRAVENOUS | Status: AC
  Administered 2021-08-10: 1000 mL via INTRAVENOUS

## 2021-08-10 MED ORDER — CEPHALEXIN 500 MG PO CAPS: 500.0000 mg | ORAL_CAPSULE | Freq: Three times a day (TID) | ORAL | 0 refills | Status: AC

## 2021-08-10 NOTE — Discharge Instructions (Addendum)
Your hcg was 1,101 today please follow-up with your OB/GYN to have your hCG rechecked in 48 hours.  I have printed your ultrasound results.  There is no evidence of abnormality on your ultrasound.  It does appear that there is an implanted embryo.  This is good news.  Please take Tylenol for pain. Follow-up closely with your OB/GYN.  Drink plenty of water  I have written you a prescription for Keflex for the bacteria that we found in your urine.  A urine culture was sent and the results of this will be available in several days.

## 2021-08-10 NOTE — ED Triage Notes (Signed)
Pt reports 1.5 months pregnant noticed vaginal bleeding onset 1 pm. Pt denies abdominal pain/cramping.

## 2021-08-10 NOTE — ED Notes (Signed)
Patient transported to Ultrasound 

## 2021-08-10 NOTE — ED Notes (Signed)
Pt NAD, a/ox4. Pt verbalizes understanding of all DC and f/u instructions. All questions answered. Pt walks with steady gait to lobby at DC.  ? ?

## 2021-08-10 NOTE — ED Provider Notes (Signed)
[facility] EMERGENCY DEPARTMENT Provider Note   CSN: [id number] Arrival date & time: 08/10/21  1516     History Chief Complaint  Patient presents with   Vaginal Bleeding    Cathy Little is a 45 y.o. female.  HPI Patient is a 45 year old female with past medical history significant for miscarriages, stillbirth and 2 healthy children at full-term  Patient is presented to the ER today she states that her last menstrual period was September 4 states that she has had positive urine pregnancy test and is scheduled to follow-up next week with a OB/GYN for her first ultrasound.  She states that today after church she went to the restroom and noticed that she was having vaginal bleeding.  She states that there is a small amount of blood in her underwear.  She states that she does not feel lightheaded or dizzy.  She denies any new abdominal pain other than achy pressure she feels from her pregnancy that she has felt in prior pregnancies.  No nausea vomiting fevers chills cough congestion lightheadedness dizziness syncope near syncope back pain neck pain and no urinary frequency urgency hematuria or dysuria.  No other associated symptoms.      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;  Service: Thoracic;  Laterality: Right;   VIDEO BRONCHOSCOPY N/A  03/18/2016   Procedure: VIDEO BRONCHOSCOPY;  Surgeon: Melrose Nakayama, MD;  Location: [place];  Service: Thoracic;  Laterality: N/A;   VIDEO BRONCHOSCOPY WITH INSERTION OF INTERBRONCHIAL VALVE (IBV) N/A 04/01/2016   Procedure: VIDEO BRONCHOSCOPY WITH INSERTION OF (2) 71mm and (1) 10mm SPIRATION INTERBRONCHIAL VALVES (IBV);  Surgeon: Melrose Nakayama, MD;  Location: [place];  Service: Thoracic;  Laterality: N/A;   VIDEO BRONCHOSCOPY WITH INSERTION OF INTERBRONCHIAL VALVE (IBV) N/A 06/01/2016   Procedure: VIDEO BRONCHOSCOPY WITH REMOVAL OF INTERBRONCHIAL VALVE (IBV);  Surgeon: Melrose Nakayama, MD;  Location: [facility] OR;  Service: Thoracic;  Laterality: N/A;     OB History     Gravida  4   Para  2   Term      Preterm      AB      Living  3      SAB      IAB      Ectopic      Multiple      Live Births  2           Family History  Problem Relation Age of Onset   Healthy Mother    Diabetes Father     Social History   Tobacco Use   Smoking status: Never    Passive exposure: Never   Smokeless tobacco: Never  Vaping Use   Vaping Use: Never used  Substance Use Topics   Alcohol use: No   Drug use: No  Home Medications Prior to Admission medications   Medication Sig Start Date End Date Taking? Authorizing Provider  cephALEXin (KEFLEX) 500 MG capsule Take 1 capsule (500 mg total) by mouth 3 (three) times daily for 5 days. 08/10/21 08/15/21 Yes Jovi Alvizo S, PA  Acetaminophen (TYLENOL PO) Take by mouth.    [provider]  Cholecalciferol (VITAMIN D3 SUPER STRENGTH) 50 MCG (2000 UT) TABS Take 2,000 Units by mouth daily. 01/09/19   [provider]  docusate sodium (COLACE) 100 MG capsule Take 1 capsule (100 mg total) by mouth daily. Patient not taking: Reported on 05/03/2019 03/14/19   Wieters, Madelynn Done C, PA-C  HYDROcodone-acetaminophen (NORCO/VICODIN) 5-325 MG tablet Take 1 tablet by mouth every 4 (four) hours as needed. 09/07/19   Larene Pickett, PA-C  ibuprofen (ADVIL) 800 MG tablet Take 1 tablet (800 mg total) by mouth 3 (three) times daily. 09/07/19   Larene Pickett, PA-C  naproxen (NAPROSYN) 500 MG tablet Take 1 tablet (500 mg total) by mouth 2 (two) times daily. Patient not taking: Reported on 05/03/2019 03/14/19   Wieters, Hallie C, PA-C  ondansetron (ZOFRAN ODT) 4 MG disintegrating tablet Take 1 tablet (4 mg total) by mouth every 8 (eight) hours as needed for nausea. 09/07/19   Larene Pickett, PA-C    Allergies    Oxycodone  Review of Systems   Review of Systems  Constitutional:  Negative for chills and fever.  HENT:  Negative for congestion.   Eyes:  Negative for pain.  Respiratory:  Negative for cough and shortness of breath.   Cardiovascular:  Negative for chest pain and leg swelling.  Gastrointestinal:  Negative for abdominal pain and vomiting.  Genitourinary:  Positive for vaginal bleeding. Negative for dysuria.  Musculoskeletal:  Negative for myalgias.  Skin:  Negative for rash.  Neurological:  Negative for dizziness and headaches.   Physical Exam Updated Vital Signs BP 102/68 (BP Location: Left Arm)   Pulse 82   Temp 98.2 F (36.8 C) (Oral)   Resp 18   Ht 5\' 6"  (1.676 m)   Wt 60.8 kg   LMP 06/22/2021 Comment: has appointment with OB on 08/18/2021  SpO2 98%   BMI 21.63 kg/m   Physical Exam Vitals and nursing note reviewed.  Constitutional:      General: She is not in acute distress. HENT:     Head: Normocephalic and atraumatic.     Nose: Nose normal.  Eyes:     General: No scleral icterus. Cardiovascular:     Rate and Rhythm: Normal rate and regular rhythm.     Pulses: Normal pulses.     Heart sounds: Normal heart sounds.  Pulmonary:     Effort: Pulmonary effort is normal. No respiratory distress.     Breath sounds: No wheezing.  Abdominal:     Palpations: Abdomen is soft.     Tenderness: There is no abdominal tenderness.     Comments: Soft nontender abdomen  Musculoskeletal:      Cervical back: Normal range of motion.     Right lower leg: No edema.     Left lower leg: No edema.  Skin:    General: Skin is warm and dry.     Capillary Refill: Capillary refill takes less than 2 seconds.  Neurological:     Mental Status: She is alert. Mental status is at baseline.  Psychiatric:        Mood and Affect: Mood normal.  Behavior: Behavior normal.    ED Results / Procedures / Treatments   Labs (all labs ordered are listed, but only abnormal results are displayed) Labs Reviewed  PREGNANCY, URINE - Abnormal; Notable for the following components:      Result Value   Preg Test, Ur POSITIVE (*)    All other components within normal limits  URINALYSIS, ROUTINE W REFLEX MICROSCOPIC - Abnormal; Notable for the following components:   APPearance HAZY (*)    Hgb urine dipstick MODERATE (*)    Ketones, ur 15 (*)    All other components within normal limits  CBC WITH DIFFERENTIAL/PLATELET - Abnormal; Notable for the following components:   WBC 3.1 (*)    All other components within normal limits  HCG, QUANTITATIVE, PREGNANCY - Abnormal; Notable for the following components:   hCG, Beta Chain, Quant, S 1,101 (*)    All other components within normal limits  URINALYSIS, MICROSCOPIC (REFLEX) - Abnormal; Notable for the following components:   Bacteria, UA FEW (*)    All other components within normal limits  URINE CULTURE  COMPREHENSIVE METABOLIC PANEL  LIPASE, BLOOD  ABO/RH    EKG None  Radiology US OB LESS THAN 14 WEEKS W/ OB TRANSVAGINAL AND DOPPLER  Result Date: 08/10/2021 CLINICAL DATA:  Vaginal bleeding for. beta HCG 1,001. Last menstrual period 06/22/2021 with gestational age by last menstrual period of 7 weeks and 0 days. EXAM: OBSTETRIC <14 WK [place] AND TRANSVAGINAL OB US DOPPLER ULTRASOUND OF OVARIES TECHNIQUE: Both transabdominal and transvaginal ultrasound examinations were performed for complete evaluation of the gestation as well as the maternal uterus,  adnexal regions, and pelvic cul-de-sac. Transvaginal technique was performed to assess early pregnancy. Color and duplex Doppler ultrasound was utilized to evaluate blood flow to the ovaries. COMPARISON:  None. FINDINGS: Intrauterine gestational sac: Single Yolk sac:  Not Visualized. Embryo:  Not Visualized. Cardiac Activity: Not Visualized. MSD: 6  mm   5 w   2  d Subchorionic hemorrhage:  None visualized. Maternal uterus/adnexae: Multiple intramural uterine fibroids are noted with the largest measuring 2.1 x 1.9 x 3.2 cm. Bilateral ovaries are unremarkable with a corpus luteum cyst within the right ovary and a 3.4 x 2.7 x 2.9 cm simple cyst within the left ovary. Pulsed Doppler evaluation of both ovaries demonstrates normal appearing low-resistance arterial and venous waveforms. IMPRESSION: 1. Probable early intrauterine gestational sac, but no yolk sac, fetal pole, or cardiac activity yet visualized. Gestational age by ultrasound of 5 weeks and 2 days is non-concordant with gestational age by last menstrual of 7 weeks and 0 days. Recommend follow-up quantitative B-HCG levels and follow-up US in 14 days to assess viability. This recommendation follows SRU consensus guidelines: Diagnostic Criteria for Nonviable Pregnancy Early in the First Trimester. [employer] Med 2013; 660:6301-60. 2. Intramural uterine fibroids. Electronically Signed   By: Iven Finn M.D.   On: 08/10/2021 21:17    Procedures Procedures   Medications Ordered in ED Medications  sodium chloride 0.9 % bolus 1,000 mL (0 mLs Intravenous Stopped 08/10/21 2120)    ED Course  I have reviewed the triage vital signs and the nursing notes.  Pertinent labs & imaging results that were available during my care of the patient were reviewed by me and considered in my medical decision making (see chart for details).  Clinical Course as of 08/10/21 2309  Nancy Fetter Aug 10, 2021  2136 HCG, Bosie Helper(!): 1,101 [WF]  2136 ABO/RH(D): O POS  [WF]  2137 Bacteria, UA(!): FEW [WF]    Clinical Course User Index [WF] Tedd Sias, PA   MDM Rules/Calculators/A&P                          Patient is a 45 year old female fluent in [place] declines [place] interpreter  Patient presented to the ER today she states she is approximately [redacted] weeks pregnant based on LMP  Began having small amount of vaginal bleeding today  Not having any significant pain  Physical exam unremarkable   I personally reviewed all laboratory work and imaging.  Metabolic panel without any acute abnormality specifically kidney function within normal limits and no significant electrolyte abnormalities. CBC without leukocytosis or significant anemia.   Lipase within normal limits at pancreatitis.  hCG elevated at 1101 which is consistent with 4 weeks of pregnancy within reasonable margin of error.  May be earlier in pregnancy than she thinks.  She will follow-up with OB/GYN in 48 hours to have repeat hCG.  Return precautions given.  Ultrasound shows probable early intrauterine gestational sac.  Doubt heterotopic pregnancy.  Early miscarriage versus more benign cause of bleeding less likely.  Patient is standing a plan and follow-up instructions.  She feels well at this time has no pain.  Discharged home  Final Clinical Impression(s) / ED Diagnoses Final diagnoses:  Vaginal bleeding in pregnancy  Asymptomatic bacteriuria    Rx / DC Orders ED Discharge Orders          Ordered    cephALEXin (KEFLEX) 500 MG capsule  3 times daily        08/10/21 2139             Tedd Sias, [place] 08/11/21 0018    Fredia Sorrow, MD 08/13/21 [phone]

## 2021-08-12 LAB — URINE CULTURE: Culture: <10000 — AB
